# Patient Record
Sex: Female | Born: 1980 | Race: White | Hispanic: No | Marital: Single | Smoking: Current every day smoker
Health system: Southern US, Community
[De-identification: ages and names within clinical notes are randomized; demographics above are authoritative.]

## PROBLEM LIST (undated history)

## (undated) ENCOUNTER — Ambulatory Visit (HOSPITAL_COMMUNITY): Admission: EM | Discharge status: Absent without leave | Payer: BLUE CROSS/BLUE SHIELD

## (undated) ENCOUNTER — Ambulatory Visit (HOSPITAL_COMMUNITY)
Admission: EM | Discharge status: Absent without leave | Payer: BLUE CROSS/BLUE SHIELD | Attending: Internal Medicine | Admitting: Internal Medicine

## (undated) ENCOUNTER — Ambulatory Visit (HOSPITAL_COMMUNITY): Admission: EM | Payer: BLUE CROSS/BLUE SHIELD | Source: Home / Self Care

## (undated) DIAGNOSIS — R51 Headache: Secondary | ICD-10-CM

## (undated) DIAGNOSIS — S0291XA Unspecified fracture of skull, initial encounter for closed fracture: Secondary | ICD-10-CM

## (undated) DIAGNOSIS — N12 Tubulo-interstitial nephritis, not specified as acute or chronic: Secondary | ICD-10-CM

## (undated) DIAGNOSIS — A749 Chlamydial infection, unspecified: Secondary | ICD-10-CM

## (undated) DIAGNOSIS — I10 Essential (primary) hypertension: Secondary | ICD-10-CM

## (undated) DIAGNOSIS — A549 Gonococcal infection, unspecified: Secondary | ICD-10-CM

## (undated) DIAGNOSIS — R519 Headache, unspecified: Secondary | ICD-10-CM

## (undated) HISTORY — PX: FACIAL NERVE DECOMPRESSION: SHX185

---
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
## 2005-01-24 ENCOUNTER — Inpatient Hospital Stay (HOSPITAL_COMMUNITY): Admission: EM | Admit: 2005-01-24 | Discharge: 2005-01-31 | Payer: Self-pay | Admitting: Emergency Medicine

## 2005-01-24 ENCOUNTER — Ambulatory Visit: Payer: Self-pay | Admitting: Physical Medicine & Rehabilitation

## 2005-02-11 ENCOUNTER — Inpatient Hospital Stay (HOSPITAL_COMMUNITY): Admission: EM | Admit: 2005-02-11 | Discharge: 2005-02-15 | Payer: Self-pay | Admitting: Emergency Medicine

## 2005-02-17 ENCOUNTER — Emergency Department (HOSPITAL_COMMUNITY): Admission: EM | Admit: 2005-02-17 | Discharge: 2005-02-17 | Payer: Self-pay | Admitting: Emergency Medicine

## 2005-02-18 ENCOUNTER — Emergency Department (HOSPITAL_COMMUNITY): Admission: EM | Admit: 2005-02-18 | Discharge: 2005-02-18 | Payer: Self-pay | Admitting: Emergency Medicine

## 2005-02-25 ENCOUNTER — Ambulatory Visit (HOSPITAL_COMMUNITY): Admission: RE | Admit: 2005-02-25 | Discharge: 2005-02-25 | Payer: Self-pay | Admitting: General Surgery

## 2005-08-13 ENCOUNTER — Emergency Department (HOSPITAL_COMMUNITY): Admission: EM | Admit: 2005-08-13 | Discharge: 2005-08-13 | Payer: Self-pay | Admitting: Emergency Medicine

## 2005-09-03 ENCOUNTER — Emergency Department (HOSPITAL_COMMUNITY): Admission: EM | Admit: 2005-09-03 | Discharge: 2005-09-03 | Payer: Self-pay | Admitting: Emergency Medicine

## 2005-11-18 ENCOUNTER — Emergency Department (HOSPITAL_COMMUNITY): Admission: EM | Admit: 2005-11-18 | Discharge: 2005-11-18 | Payer: Self-pay | Admitting: Emergency Medicine

## 2006-02-06 ENCOUNTER — Emergency Department (HOSPITAL_COMMUNITY): Admission: EM | Admit: 2006-02-06 | Discharge: 2006-02-06 | Payer: Self-pay | Admitting: Emergency Medicine

## 2007-01-23 ENCOUNTER — Emergency Department (HOSPITAL_COMMUNITY): Admission: EM | Admit: 2007-01-23 | Discharge: 2007-01-23 | Payer: Self-pay | Admitting: Family Medicine

## 2007-02-19 ENCOUNTER — Emergency Department (HOSPITAL_COMMUNITY): Admission: EM | Admit: 2007-02-19 | Discharge: 2007-02-19 | Payer: Self-pay | Admitting: Emergency Medicine

## 2007-07-13 ENCOUNTER — Emergency Department (HOSPITAL_COMMUNITY): Admission: EM | Admit: 2007-07-13 | Discharge: 2007-07-13 | Payer: Self-pay | Admitting: Family Medicine

## 2007-09-07 IMAGING — CT CT HEAD W/O CM
1 of 4 series · 10 of 30 positions shown, 13 images · IV contrast (agent unspecified)
Comparison: 01/25/2005.

CLINICAL DATA: Brain contusions.  Follow-up.
 HEAD CT WITHOUT CONTRAST:
TECHNIQUE: Contiguous axial images were obtained from the base of the skull through the vertex according to standard protocol without contrast.

[Series 2: brain · axial · 0.47mm/px · z∈[+118,+249]mm · 10 of 32 slices shown, 13 images]
[im 3/32  brain]
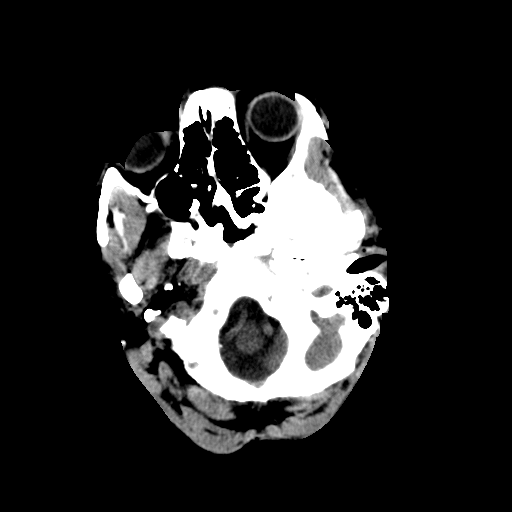
[im 3/32  bone]
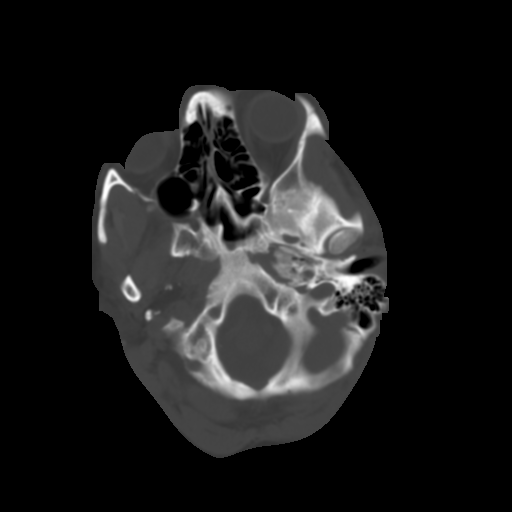
[im 6/32  brain]
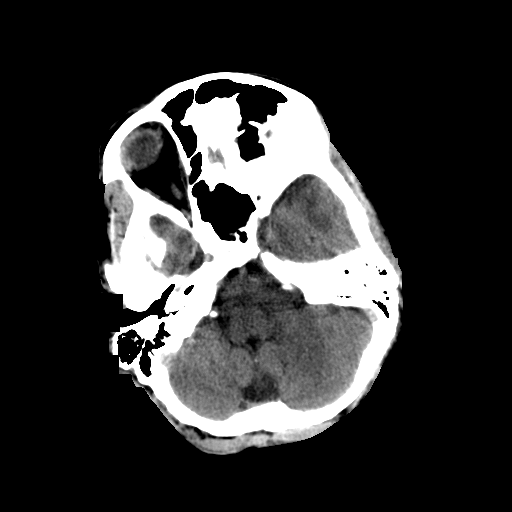
[im 9/32  brain]
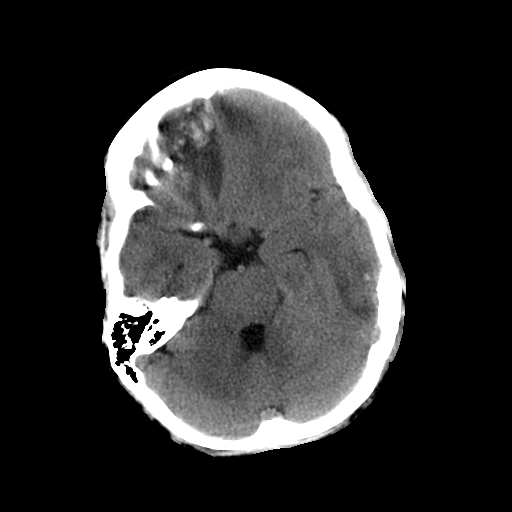
[im 12/32  brain]
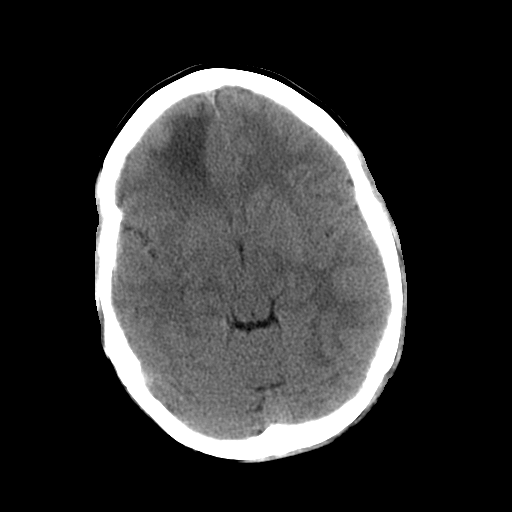
[im 15/32  brain]
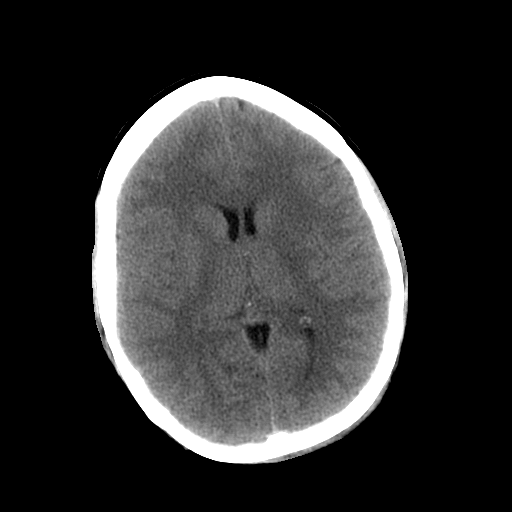
[im 15/32  bone]
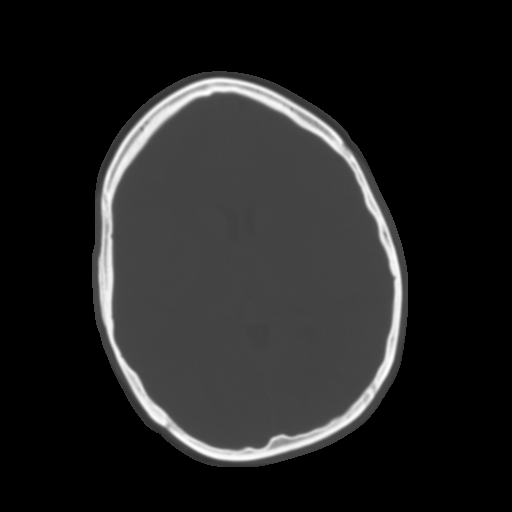
[im 17/32  brain]
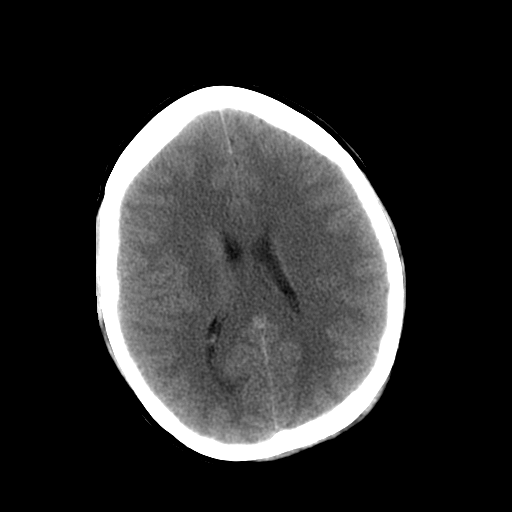
[im 20/32  brain]
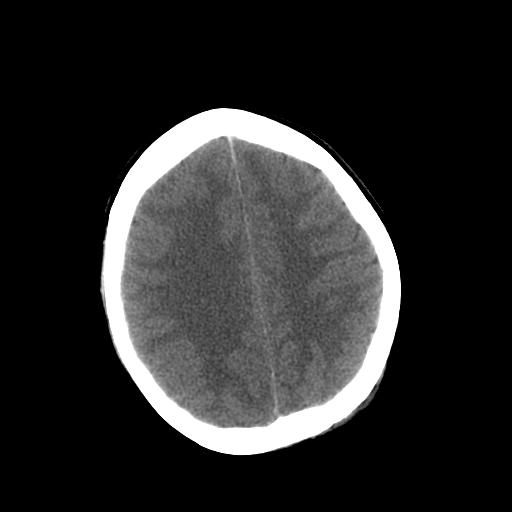
[im 23/32  brain]
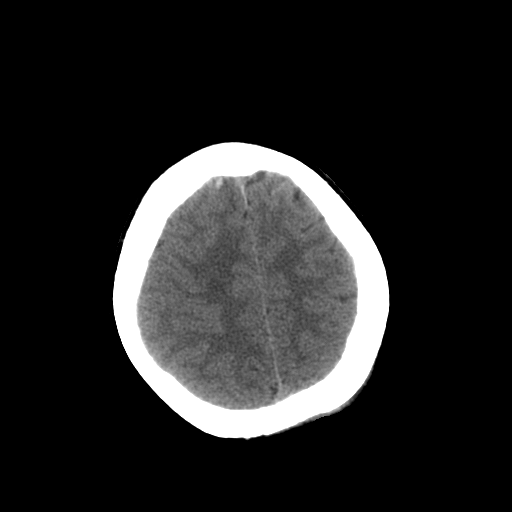
[im 26/32  brain]
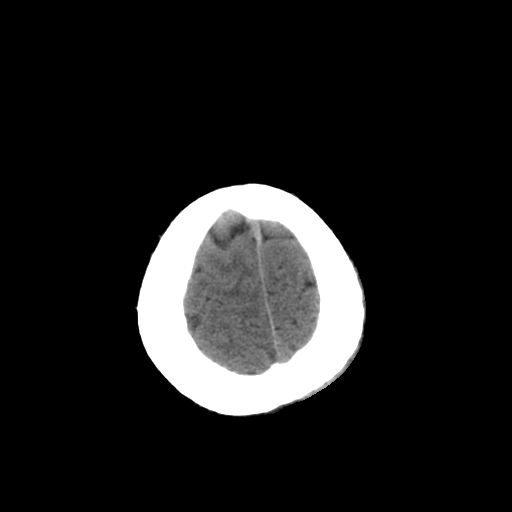
[im 26/32  bone]
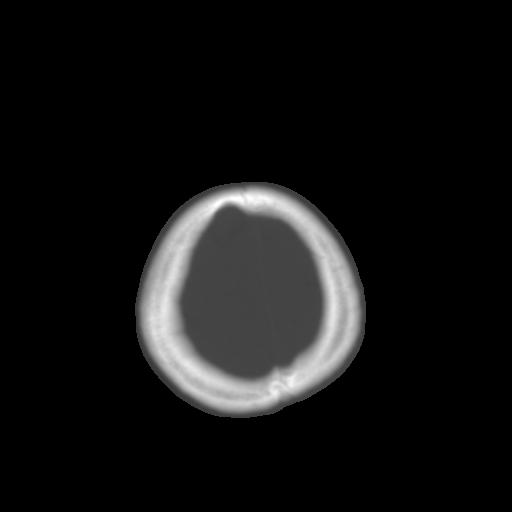
[im 29/32  brain]
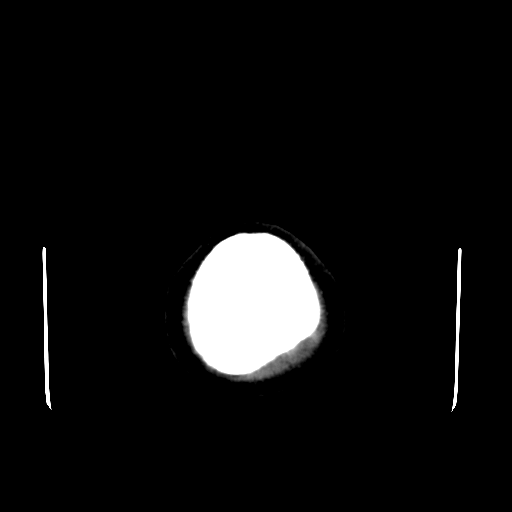

[10 of 30 positions shown; findings below may reference images not displayed]

FINDINGS: Within the right frontal lobe (image #22) focal area of contusion and hemorrhage is again noted.  When compared with the prior exam, the appearance is unchanged.  In the inferior right frontal lobe anteriorly there is a contusion which extends across the midline.  The hemorrhagic component is stable to slightly improved in the interval.  Surrounding low attenuation consistent with edema is not changed.
 Left temporal lobe there is additional contusion with small hemorrhagic component unchanged.
 No new areas of hemorrhage are identified.
 There is no evidence for hydrocephalus.
 Mastoid air cells are normally aerated.  There is mild sphenoid sinus disease.  The remaining paranasal sinuses are normally aerated.
 Complex fracture of the left temporal bone with multiple fracture planes again identified.  This is not significantly changed when compared to the prior exam.
IMPRESSION: 1. Multiple evolving hemorrhagic contusions to the brain.
 2. Overall, no significant interval change.
 3. Complex non-displaced comminuted fracture through the left temporal bone stable.

## 2007-12-18 ENCOUNTER — Emergency Department (HOSPITAL_COMMUNITY): Admission: EM | Admit: 2007-12-18 | Discharge: 2007-12-18 | Payer: Self-pay | Admitting: Emergency Medicine

## 2007-12-19 ENCOUNTER — Emergency Department (HOSPITAL_COMMUNITY): Admission: EM | Admit: 2007-12-19 | Discharge: 2007-12-19 | Payer: Self-pay | Admitting: Emergency Medicine

## 2007-12-20 ENCOUNTER — Emergency Department (HOSPITAL_COMMUNITY): Admission: EM | Admit: 2007-12-20 | Discharge: 2007-12-21 | Payer: Self-pay | Admitting: Emergency Medicine

## 2007-12-22 ENCOUNTER — Emergency Department (HOSPITAL_COMMUNITY): Admission: EM | Admit: 2007-12-22 | Discharge: 2007-12-22 | Payer: Self-pay | Admitting: Family Medicine

## 2008-05-05 ENCOUNTER — Emergency Department (HOSPITAL_COMMUNITY): Admission: EM | Admit: 2008-05-05 | Discharge: 2008-05-05 | Payer: Self-pay | Admitting: Emergency Medicine

## 2008-07-01 ENCOUNTER — Emergency Department (HOSPITAL_COMMUNITY): Admission: EM | Admit: 2008-07-01 | Discharge: 2008-07-01 | Payer: Self-pay | Admitting: Family Medicine

## 2008-07-20 ENCOUNTER — Emergency Department (HOSPITAL_COMMUNITY): Admission: EM | Admit: 2008-07-20 | Discharge: 2008-07-20 | Payer: Self-pay | Admitting: Family Medicine

## 2009-02-27 ENCOUNTER — Emergency Department (HOSPITAL_COMMUNITY): Admission: EM | Admit: 2009-02-27 | Discharge: 2009-02-27 | Payer: Self-pay | Admitting: Family Medicine

## 2009-05-16 ENCOUNTER — Emergency Department (HOSPITAL_COMMUNITY): Admission: EM | Admit: 2009-05-16 | Discharge: 2009-05-16 | Payer: Self-pay | Admitting: Emergency Medicine

## 2009-05-25 ENCOUNTER — Emergency Department (HOSPITAL_COMMUNITY): Admission: EM | Admit: 2009-05-25 | Discharge: 2009-05-25 | Payer: Self-pay | Admitting: Family Medicine

## 2009-05-26 ENCOUNTER — Emergency Department (HOSPITAL_COMMUNITY): Admission: EM | Admit: 2009-05-26 | Discharge: 2009-05-26 | Payer: Self-pay | Admitting: Emergency Medicine

## 2009-11-26 ENCOUNTER — Emergency Department (HOSPITAL_COMMUNITY): Admission: EM | Admit: 2009-11-26 | Discharge: 2009-11-26 | Payer: Self-pay | Admitting: Family Medicine

## 2010-05-01 LAB — POCT URINALYSIS DIPSTICK
Bilirubin Urine: NEGATIVE
Glucose, UA: NEGATIVE mg/dL
Ketones, ur: NEGATIVE mg/dL
Nitrite: NEGATIVE
Protein, ur: NEGATIVE mg/dL
Specific Gravity, Urine: 1.01 (ref 1.005–1.030)
Urobilinogen, UA: 0.2 mg/dL (ref 0.0–1.0)
pH: 7 (ref 5.0–8.0)

## 2010-05-01 LAB — URINE CULTURE
Colony Count: NO GROWTH
Culture  Setup Time: 201110111743
Culture: NO GROWTH

## 2010-05-01 LAB — GC/CHLAMYDIA PROBE AMP, GENITAL
Chlamydia, DNA Probe: NEGATIVE
GC Probe Amp, Genital: NEGATIVE

## 2010-05-01 LAB — WET PREP, GENITAL
Trich, Wet Prep: NONE SEEN
Yeast Wet Prep HPF POC: NONE SEEN

## 2010-05-01 LAB — POCT PREGNANCY, URINE: Preg Test, Ur: NEGATIVE

## 2010-05-04 LAB — GC/CHLAMYDIA PROBE AMP, GENITAL
Chlamydia, DNA Probe: NEGATIVE
GC Probe Amp, Genital: NEGATIVE

## 2010-05-11 LAB — WET PREP, GENITAL
Trich, Wet Prep: NONE SEEN
Yeast Wet Prep HPF POC: NONE SEEN

## 2010-05-11 LAB — GC/CHLAMYDIA PROBE AMP, GENITAL
Chlamydia, DNA Probe: NEGATIVE
GC Probe Amp, Genital: NEGATIVE

## 2010-05-26 LAB — WET PREP, GENITAL: Trich, Wet Prep: NONE SEEN

## 2010-05-26 LAB — GC/CHLAMYDIA PROBE AMP, GENITAL: GC Probe Amp, Genital: NEGATIVE

## 2010-05-27 LAB — GC/CHLAMYDIA PROBE AMP, GENITAL: Chlamydia, DNA Probe: NEGATIVE

## 2010-05-27 LAB — WET PREP, GENITAL: Yeast Wet Prep HPF POC: NONE SEEN

## 2010-05-29 LAB — POCT PREGNANCY, URINE: Preg Test, Ur: NEGATIVE

## 2010-05-29 LAB — URINE CULTURE: Colony Count: 15000

## 2010-05-29 LAB — URINALYSIS, ROUTINE W REFLEX MICROSCOPIC
Bilirubin Urine: NEGATIVE
Ketones, ur: NEGATIVE mg/dL
Nitrite: NEGATIVE
Urobilinogen, UA: 0.2 mg/dL (ref 0.0–1.0)
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
## 2010-10-20 ENCOUNTER — Emergency Department (HOSPITAL_COMMUNITY)
Admission: EM | Admit: 2010-10-20 | Discharge: 2010-10-20 | Disposition: A | Discharge status: Home / Self Care | Payer: Self-pay | Attending: Emergency Medicine | Admitting: Emergency Medicine

## 2010-10-20 DIAGNOSIS — N390 Urinary tract infection, site not specified: Secondary | ICD-10-CM | POA: Insufficient documentation

## 2010-10-20 DIAGNOSIS — R630 Anorexia: Secondary | ICD-10-CM | POA: Insufficient documentation

## 2010-10-20 DIAGNOSIS — F988 Other specified behavioral and emotional disorders with onset usually occurring in childhood and adolescence: Secondary | ICD-10-CM | POA: Insufficient documentation

## 2010-10-20 DIAGNOSIS — R112 Nausea with vomiting, unspecified: Secondary | ICD-10-CM | POA: Insufficient documentation

## 2010-10-20 DIAGNOSIS — R1013 Epigastric pain: Secondary | ICD-10-CM | POA: Insufficient documentation

## 2010-10-20 LAB — RAPID URINE DRUG SCREEN, HOSP PERFORMED
Amphetamines: NOT DETECTED
Benzodiazepines: NOT DETECTED
Opiates: NOT DETECTED

## 2010-10-20 LAB — CBC
MCV: 90.5 fL (ref 78.0–100.0)
Platelets: 268 10*3/uL (ref 150–400)
RBC: 4.42 MIL/uL (ref 3.87–5.11)
WBC: 12.6 10*3/uL — ABNORMAL HIGH (ref 4.0–10.5)

## 2010-10-20 LAB — LIPASE, BLOOD: Lipase: 18 U/L (ref 11–59)

## 2010-10-20 LAB — URINE MICROSCOPIC-ADD ON

## 2010-10-20 LAB — COMPREHENSIVE METABOLIC PANEL
BUN: 6 mg/dL (ref 6–23)
Calcium: 9.6 mg/dL (ref 8.4–10.5)
Creatinine, Ser: 0.47 mg/dL — ABNORMAL LOW (ref 0.50–1.10)
GFR calc Af Amer: >60 mL/min (ref >60)
Glucose, Bld: 142 mg/dL — ABNORMAL HIGH (ref 70–99)
Sodium: 145 mEq/L (ref 135–145)
Total Protein: 7.5 g/dL (ref 6.0–8.3)

## 2010-10-20 LAB — DIFFERENTIAL
Eosinophils Absolute: 0.1 10*3/uL (ref 0.0–0.7)
Lymphocytes Relative: 16 % (ref 12–46)
Lymphs Abs: 2 10*3/uL (ref 0.7–4.0)
Neutrophils Relative %: 78 % — ABNORMAL HIGH (ref 43–77)

## 2010-10-20 LAB — ETHANOL: Alcohol, Ethyl (B): <11 mg/dL (ref 0–11)

## 2010-10-20 LAB — URINALYSIS, ROUTINE W REFLEX MICROSCOPIC
Bilirubin Urine: NEGATIVE
Protein, ur: 30 mg/dL — AB
Urobilinogen, UA: 1 mg/dL (ref 0.0–1.0)

## 2010-10-20 LAB — WET PREP, GENITAL
Trich, Wet Prep: NONE SEEN
Yeast Wet Prep HPF POC: NONE SEEN

## 2010-10-22 LAB — URINE CULTURE
Colony Count: >=100000
Culture  Setup Time: 201209031142

## 2010-11-06 LAB — WET PREP, GENITAL
Clue Cells Wet Prep HPF POC: NONE SEEN
Trich, Wet Prep: NONE SEEN
Yeast Wet Prep HPF POC: NONE SEEN

## 2010-11-06 LAB — URINALYSIS, ROUTINE W REFLEX MICROSCOPIC
Bilirubin Urine: NEGATIVE
Hgb urine dipstick: NEGATIVE
Ketones, ur: NEGATIVE
Nitrite: NEGATIVE
Urobilinogen, UA: 1

## 2010-11-06 LAB — GC/CHLAMYDIA PROBE AMP, GENITAL: GC Probe Amp, Genital: NEGATIVE

## 2010-11-12 LAB — WET PREP, GENITAL
Trich, Wet Prep: NONE SEEN
WBC, Wet Prep HPF POC: NONE SEEN
Yeast Wet Prep HPF POC: NONE SEEN

## 2010-11-12 LAB — POCT URINALYSIS DIP (DEVICE)
Ketones, ur: NEGATIVE
Nitrite: POSITIVE — AB
Urobilinogen, UA: 0.2

## 2010-11-12 LAB — POCT PREGNANCY, URINE
Operator id: 247071
Preg Test, Ur: NEGATIVE

## 2010-11-12 LAB — GC/CHLAMYDIA PROBE AMP, GENITAL: GC Probe Amp, Genital: NEGATIVE

## 2010-11-24 LAB — URINALYSIS, ROUTINE W REFLEX MICROSCOPIC
Bilirubin Urine: NEGATIVE
Hgb urine dipstick: NEGATIVE
Nitrite: NEGATIVE
Protein, ur: NEGATIVE
Specific Gravity, Urine: 1.021
Urobilinogen, UA: 0.2

## 2010-11-24 LAB — GC/CHLAMYDIA PROBE AMP, URINE
Chlamydia, Swab/Urine, PCR: NEGATIVE
GC Probe Amp, Urine: POSITIVE — AB

## 2010-11-24 LAB — POCT URINALYSIS DIP (DEVICE)
Ketones, ur: NEGATIVE
Operator id: 235561
Protein, ur: NEGATIVE
Specific Gravity, Urine: 1.02
pH: 6

## 2010-11-24 LAB — URINE MICROSCOPIC-ADD ON

## 2010-11-24 LAB — URINE CULTURE: Colony Count: 30000

## 2011-05-07 ENCOUNTER — Encounter (HOSPITAL_COMMUNITY): Payer: Self-pay

## 2011-05-07 ENCOUNTER — Emergency Department (HOSPITAL_COMMUNITY)
Admission: EM | Admit: 2011-05-07 | Discharge: 2011-05-07 | Disposition: A | Discharge status: Home / Self Care | Payer: Self-pay | Attending: Emergency Medicine | Admitting: Emergency Medicine

## 2011-05-07 DIAGNOSIS — N949 Unspecified condition associated with female genital organs and menstrual cycle: Secondary | ICD-10-CM | POA: Insufficient documentation

## 2011-05-07 DIAGNOSIS — N750 Cyst of Bartholin's gland: Secondary | ICD-10-CM

## 2011-05-07 DIAGNOSIS — N9489 Other specified conditions associated with female genital organs and menstrual cycle: Secondary | ICD-10-CM | POA: Insufficient documentation

## 2011-05-07 DIAGNOSIS — N751 Abscess of Bartholin's gland: Secondary | ICD-10-CM | POA: Insufficient documentation

## 2011-05-07 MED ORDER — CLINDAMYCIN HCL 150 MG PO CAPS: 150.0000 mg | ORAL_CAPSULE | Freq: Four times a day (QID) | ORAL | Status: AC

## 2011-05-07 MED ORDER — OXYCODONE-ACETAMINOPHEN 5-325 MG PO TABS: 1.0000 | ORAL_TABLET | Freq: Four times a day (QID) | ORAL | Status: AC | PRN

## 2011-05-07 MED ORDER — OXYCODONE-ACETAMINOPHEN 5-325 MG PO TABS
2.0000 | ORAL_TABLET | Freq: Once | ORAL | Status: AC
  Administered 2011-05-07: 2 via ORAL
  Filled 2011-05-07: qty 2

## 2011-05-07 MED ORDER — LIDOCAINE HCL 1 % IJ SOLN
30.0000 mL | Freq: Once | INTRAMUSCULAR | Status: AC
  Administered 2011-05-07: 20 mL via INTRADERMAL
  Filled 2011-05-07: qty 20

## 2011-05-07 MED ORDER — ONDANSETRON 4 MG PO TBDP
4.0000 mg | ORAL_TABLET | Freq: Once | ORAL | Status: AC
  Administered 2011-05-07: 4 mg via ORAL
  Filled 2011-05-07: qty 1

## 2011-05-07 NOTE — Discharge Instructions (Signed)
Bartholin's Cyst or Abscess Bartholin's glands are small glands located within the folds of skin (labia) along the sides of the lower opening of the vagina (birth canal). A cyst may develop when the duct of the gland becomes blocked. When this happens, fluid that accumulates within the cyst can become infected. This is known as an abscess. The Bartholin gland produces a mucous fluid to lubricate the outside of the vagina during sexual intercourse. SYMPTOMS   Patients with a small cyst may not have any symptoms.   Mild discomfort to severe pain depending on the size of the cyst and if it is infected (abscess).   Pain, redness, and swelling around the lower opening of the vagina.   Painful intercourse.   Pressure in the perineal area.   Swelling of the lips of the vagina (labia).   The cyst or abscess can be on one side or both sides of the vagina.  DIAGNOSIS   A large swelling is seen in the lower vagina area by your caregiver.   Painful to touch.   Redness and pain, if it is an abscess.  TREATMENT   Sometimes the cyst will go away on its own.   Apply warm wet compresses to the area or take hot sitz baths several times a day.   An incision to drain the cyst or abscess with local anesthesia.   Culture the pus, if it is an abscess.   Antibiotic treatment, if it is an abscess.   Cut open the gland and suture the edges to make the opening of the gland bigger (marsupialization).   Remove the whole gland if the cyst or abscess returns.  PREVENTION   Practice good hygiene.   Clean the vaginal area with a mild soap and soft cloth when bathing.   Do not rub hard in the vaginal area when bathing.   Protect the crotch area with a padded cushion if you take long bike rides or ride horses.   Be sure you are well lubricated when you have sexual intercourse.  HOME CARE INSTRUCTIONS   If your cyst or abscess was opened, a small piece of gauze, or a drain, may have been placed in  the wound to allow drainage. Do not remove this gauze or drain unless directed by your caregiver.   Wear feminine pads, not tampons, as needed for any drainage or bleeding.   If antibiotics were prescribed, take them exactly as directed. Finish the entire course.   Only take over-the-counter or prescription medicines for pain, discomfort, or fever as directed by your caregiver.  SEEK IMMEDIATE MEDICAL CARE IF:   You have an increase in pain, redness, swelling, or drainage.   You have bleeding from the wound which results in the use of more than the number of pads suggested by your caregiver in 24 hours.   You have chills.   You have a fever.   You develop any new problems (symptoms) or aggravation of your existing condition.  MAKE SURE YOU:   Understand these instructions.   Will watch your condition.   Will get help right away if you are not doing well or get worse.  Document Released: 02/02/2005 Document Revised: 01/22/2011 Document Reviewed: 09/21/2007 [employer] Patient Information 2012 [employer]. 

## 2011-05-07 NOTE — ED Notes (Signed)
Pt complains of a sore knot in her vaginal area, hx of bartholians cysts

## 2011-05-07 NOTE — ED Provider Notes (Signed)
History     CSN: [id number]  Arrival date & time 05/07/11  1191   First MD Initiated Contact with Patient 05/07/11 [phone]      Chief Complaint  Patient presents with  . Recurrent Skin Infections    (Consider location/radiation/quality/duration/timing/severity/associated sxs/prior treatment) HPI  31 year old female with history of Bartholin's cyst presents with the chief complaints of vaginal swelling. The patient has been noticing swelling to the vagina area for the past one to 2 days. Symptoms was gradual but progressive. Described pain as a throbbing sensation. Swelling felt similar to her prior Bartholin cyst that required drainage. She denies fever, abdominal pain or, dysuria, vaginal discharge, or rash. She denies any recent trauma. She does not think she's pregnant.  History reviewed. No pertinent past medical history.  History reviewed. No pertinent past surgical history.  History reviewed. No pertinent family history.  History  Substance Use Topics  . Smoking status: Not on file  . Smokeless tobacco: Not on file  . Alcohol Use: Yes    OB History    Grav Para Term Preterm Abortions TAB SAB Ect Mult Living                  Review of Systems  All other systems reviewed and are negative.    Allergies  Sulfa antibiotics  Home Medications   Current Outpatient Rx  Name Route Sig Dispense Refill  . IBUPROFEN 200 MG PO TABS Oral Take 400 mg by mouth every 6 (six) hours as needed. Pain      BP 127/75  Pulse 89  Temp(Src) 98.6 F (37 C) (Oral)  Resp 16  Ht 5\' 6"  (1.676 m)  Wt 156 lb 12.8 oz (71.124 kg)  BMI 25.31 kg/m2  SpO2 100%  Physical Exam  Nursing note and vitals reviewed. Constitutional: She appears well-developed and well-nourished. No distress.  HENT:  Head: Atraumatic.  Eyes: Conjunctivae are normal.  Abdominal: Soft. There is no tenderness.  Musculoskeletal: Normal range of motion.  Skin:       Vagina: Moderate edema, and fluctuance noted  lateral to lateral aspect of left labial consistence with a Bartholin's abscess.  Tenderness on palpation. No erythema noted. Non-petechial    ED Course  Procedures (including critical care time)  Labs Reviewed - No data to display No results found.   No diagnosis found.  INCISION AND DRAINAGE Performed by: Fayrene Helper Consent: Verbal consent obtained. Risks and benefits: risks, benefits and alternatives were discussed Type: abscess  Body area: L bartholin's abscess  Anesthesia: local infiltration  Local anesthetic: lidocaine 2% w/out epinephrine  Anesthetic total: 3 ml  Complexity: complex Blunt dissection to break up loculations  Drainage: purulent  Drainage amount: moderate  Packing material: No packing placed, no Word Catheter as pt refuses  Patient tolerance: Patient tolerated the procedure well with no immediate complications.   MDM  Likely Bartholin's cyst which may require I&D. Pain medication given.  8:33 AM Successful I&D of Bartholin's cyst. Patient requests not to place any catheter or packing as she has allergic reaction in the past. Care instructions given. Patient will be discharged with antibiotic and pain medication with followup instructions.        Fayrene Helper, PA-C 05/07/11 [phone]

## 2011-05-07 NOTE — ED Provider Notes (Signed)
Medical screening examination/treatment/procedure(s) were performed by non-physician practitioner and as supervising physician I was immediately available for consultation/collaboration.  Geoffery Lyons, MD 05/07/11 [phone]

## 2011-06-24 ENCOUNTER — Emergency Department (HOSPITAL_COMMUNITY): Payer: Self-pay

## 2011-06-24 ENCOUNTER — Observation Stay (HOSPITAL_COMMUNITY)
Admission: EM | Admit: 2011-06-24 | Discharge: 2011-06-25 | Disposition: A | Discharge status: Home / Self Care | Payer: Self-pay | Attending: Neurosurgery | Admitting: Neurosurgery

## 2011-06-24 ENCOUNTER — Encounter (HOSPITAL_COMMUNITY): Payer: Self-pay | Admitting: Emergency Medicine

## 2011-06-24 DIAGNOSIS — Y998 Other external cause status: Secondary | ICD-10-CM | POA: Insufficient documentation

## 2011-06-24 DIAGNOSIS — Y92009 Unspecified place in unspecified non-institutional (private) residence as the place of occurrence of the external cause: Secondary | ICD-10-CM | POA: Insufficient documentation

## 2011-06-24 DIAGNOSIS — S02109A Fracture of base of skull, unspecified side, initial encounter for closed fracture: Principal | ICD-10-CM | POA: Insufficient documentation

## 2011-06-24 DIAGNOSIS — F17200 Nicotine dependence, unspecified, uncomplicated: Secondary | ICD-10-CM | POA: Insufficient documentation

## 2011-06-24 DIAGNOSIS — S0291XA Unspecified fracture of skull, initial encounter for closed fracture: Secondary | ICD-10-CM

## 2011-06-24 DIAGNOSIS — G9389 Other specified disorders of brain: Secondary | ICD-10-CM | POA: Insufficient documentation

## 2011-06-24 DIAGNOSIS — W108XXA Fall (on) (from) other stairs and steps, initial encounter: Secondary | ICD-10-CM | POA: Insufficient documentation

## 2011-06-24 LAB — BASIC METABOLIC PANEL
BUN: 6 mg/dL (ref 6–23)
CO2: 23 mEq/L (ref 19–32)
Calcium: 8.8 mg/dL (ref 8.4–10.5)
Chloride: 107 mEq/L (ref 96–112)
GFR calc Af Amer: >90 mL/min (ref >90)
Glucose, Bld: 110 mg/dL — ABNORMAL HIGH (ref 70–99)
Potassium: 3.7 mEq/L (ref 3.5–5.1)
Sodium: 141 mEq/L (ref 135–145)

## 2011-06-24 LAB — CBC
HCT: 40.6 % (ref 36.0–46.0)
Hemoglobin: 14.1 g/dL (ref 12.0–15.0)
RBC: 4.42 MIL/uL (ref 3.87–5.11)
WBC: 16.5 10*3/uL — ABNORMAL HIGH (ref 4.0–10.5)

## 2011-06-24 LAB — MRSA PCR SCREENING: MRSA by PCR: NEGATIVE

## 2011-06-24 MED ORDER — HYDROCODONE-ACETAMINOPHEN 5-325 MG PO TABS
2.0000 | ORAL_TABLET | ORAL | Status: DC | PRN
  Administered 2011-06-24: 2 via ORAL
  Filled 2011-06-24: qty 2

## 2011-06-24 MED ORDER — ONDANSETRON HCL 4 MG/2ML IJ SOLN
4.0000 mg | Freq: Once | INTRAMUSCULAR | Status: AC
  Administered 2011-06-24: 4 mg via INTRAVENOUS
  Filled 2011-06-24: qty 2

## 2011-06-24 MED ORDER — SODIUM CHLORIDE 0.9 % IV SOLN
INTRAVENOUS | Status: DC
  Administered 2011-06-24: 08:00:00 via INTRAVENOUS

## 2011-06-24 MED ORDER — ONDANSETRON HCL 4 MG/2ML IJ SOLN
4.0000 mg | INTRAMUSCULAR | Status: DC | PRN
  Administered 2011-06-24 – 2011-06-25 (×3): 4 mg via INTRAVENOUS
  Filled 2011-06-24 (×2): qty 2

## 2011-06-24 MED ORDER — ONDANSETRON HCL 4 MG/2ML IJ SOLN
INTRAMUSCULAR | Status: AC
  Filled 2011-06-24: qty 2

## 2011-06-24 MED ORDER — ONDANSETRON HCL 4 MG/2ML IJ SOLN: 4.0000 mg | Freq: Four times a day (QID) | INTRAMUSCULAR | Status: DC | PRN

## 2011-06-24 MED ORDER — MORPHINE SULFATE 2 MG/ML IJ SOLN
2.0000 mg | INTRAMUSCULAR | Status: DC | PRN
  Administered 2011-06-24 – 2011-06-25 (×4): 2 mg via INTRAVENOUS
  Filled 2011-06-24 (×4): qty 1

## 2011-06-24 MED ORDER — ONDANSETRON HCL 4 MG PO TABS
4.0000 mg | ORAL_TABLET | ORAL | Status: DC | PRN
  Administered 2011-06-24: 4 mg via ORAL
  Filled 2011-06-24: qty 1

## 2011-06-24 MED ORDER — ONDANSETRON 8 MG PO TBDP
8.0000 mg | ORAL_TABLET | Freq: Once | ORAL | Status: AC
  Administered 2011-06-24: 8 mg via ORAL
  Filled 2011-06-24: qty 1

## 2011-06-24 MED ORDER — ONDANSETRON HCL 4 MG/2ML IJ SOLN
4.0000 mg | Freq: Four times a day (QID) | INTRAMUSCULAR | Status: DC | PRN
  Administered 2011-06-24: 4 mg via INTRAVENOUS
  Filled 2011-06-24: qty 2

## 2011-06-24 MED ORDER — HYDROCODONE-ACETAMINOPHEN 5-325 MG PO TABS
1.0000 | ORAL_TABLET | ORAL | Status: DC | PRN
  Administered 2011-06-24: 1 via ORAL
  Filled 2011-06-24 (×2): qty 1

## 2011-06-24 MED ORDER — MECLIZINE HCL 25 MG PO TABS
25.0000 mg | ORAL_TABLET | Freq: Once | ORAL | Status: AC
  Administered 2011-06-24: 25 mg via ORAL
  Filled 2011-06-24: qty 1

## 2011-06-24 MED ORDER — MORPHINE SULFATE 4 MG/ML IJ SOLN
4.0000 mg | Freq: Once | INTRAMUSCULAR | Status: AC
  Administered 2011-06-24: 4 mg via INTRAVENOUS
  Filled 2011-06-24: qty 1

## 2011-06-24 MED ORDER — POTASSIUM CHLORIDE 2 MEQ/ML IV SOLN
INTRAVENOUS | Status: DC
  Administered 2011-06-24 – 2011-06-25 (×2): via INTRAVENOUS
  Filled 2011-06-24 (×3): qty 1000

## 2011-06-24 MED ORDER — CEFAZOLIN SODIUM 1-5 GM-% IV SOLN
1.0000 g | Freq: Once | INTRAVENOUS | Status: AC
  Administered 2011-06-24: 1 g via INTRAVENOUS
  Filled 2011-06-24: qty 50

## 2011-06-24 MED ORDER — ONDANSETRON HCL 4 MG PO TABS: 4.0000 mg | ORAL_TABLET | Freq: Four times a day (QID) | ORAL | Status: DC | PRN

## 2011-06-24 MED ORDER — ACETAMINOPHEN 325 MG PO TABS: 650.0000 mg | ORAL_TABLET | ORAL | Status: DC | PRN

## 2011-06-24 MED ORDER — ONDANSETRON HCL 4 MG PO TABS: 4.0000 mg | ORAL_TABLET | ORAL | Status: DC | PRN
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
## 2011-06-24 NOTE — H&P (Signed)
Subjective: The patient is a 31 year old white female who fell down some steps approximately 0 3:30 this morning. There was no reported loss of consciousness. Although she did feel a bit dizzy. The patient noted some blood coming from her left ear and went to the [facility] long emergency department. There she was worked up by Dr. Erlinda Hong to include a head CT scan. This demonstrated a left temporal fracture and a neurosurgical consultation was requested. The patient is transferred to [facility] for further neurosurgical care.  Presently the patient is accompanied by her fiber and a friend. She complains of left temporal headaches. She says the blood has stopped draining from her ear. She denies nausea vomiting seizures numbness tingling weakness neck pain back pain chest pain abdominal pain etc.  History reviewed. No pertinent past medical history.  History reviewed. No pertinent past surgical history.  Allergies  Allergen Reactions  . Sulfa Antibiotics     History  Substance Use Topics  . Smoking status: Current Everyday Smoker -- 0.5 packs/day for 15 years    Types: Cigarettes  . Smokeless tobacco: Not on file  . Alcohol Use: No    History reviewed. No pertinent family history. Prior to Admission medications   Medication Sig Start Date End Date Taking? Authorizing Provider  ibuprofen (ADVIL,MOTRIN) 200 MG tablet Take 400 mg by mouth every 6 (six) hours as needed. Pain   Yes Historical Provider, MD     Review of Systems  Positive ROS: As above  All other systems have been reviewed and were otherwise negative with the exception of those mentioned in the HPI and as above.  Objective: Vital signs in last 24 hours: Temp:  [98.3 F (36.8 C)-98.6 F (37 C)] 98.3 F (36.8 C) (05/08 0930) Pulse Rate:  [64-89] 69  (05/08 1200) Resp:  [15-23] 23  (05/08 1200) BP: (95-132)/(60-83) 95/60 mmHg (05/08 1200) SpO2:  [98 %-100 %] 98 % (05/08 1200) Weight:  [68.947 kg (152 lb)-70 kg  (154 lb 5.2 oz)] 70 kg (154 lb 5.2 oz) (05/08 1000)  General Appearance: Alert, cooperative, no distress, appears stated age Head: Normocephalic, without obvious abnormality, atraumatic Eyes: PERRL, conjunctiva/corneas clear, EOM's intact, fundi benign, both eyes      Ears: Normal TM's and external ear canals, both ears Throat: Lips, mucosa, and tongue normal; teeth and gums normal Neck: Supple, symmetrical, trachea midline, no adenopathy; thyroid: No enlargement/tenderness/nodules; no carotid bruit or JVD Back: Symmetric, no curvature, ROM normal, no CVA tenderness Lungs: Clear to auscultation bilaterally, respirations unlabored Heart: Regular rate and rhythm, S1 and S2 normal, no murmur, rub or gallop Abdomen: Soft, non-tender, bowel sounds active all four quadrants, no masses, no organomegaly Extremities: Extremities normal, atraumatic, no cyanosis or edema Pulses: 2+ and symmetric all extremities Skin: Skin color, texture, turgor normal, no rashes or lesions  NEUROLOGIC:   Mental status: alert and oriented, no aphasia, good attention span, Fund of knowledge/ memory ok Motor Exam - grossly normal Sensory Exam - grossly normal Reflexes: Symmetric Coordination - grossly normal Gait -not tested Balance - grossly normal Cranial Nerves: I: smell Not tested  II: visual acuity  OS: Normal    OD: Normal   II: visual fields Full to confrontation  II: pupils Equal, round, reactive to light  III,VII: ptosis None  III,IV,VI: extraocular muscles  Full ROM  V: mastication Normal  V: facial light touch sensation  Normal  V,VII: corneal reflex  Present  VII: facial muscle function - upper  Normal  VII: facial muscle function - lower Normal  VIII: hearing Not tested  IX: soft palate elevation  Normal  IX,X: gag reflex Present  XI: trapezius strength  5/5  XI: sternocleidomastoid strength 5/5  XI: neck flexion strength  5/5  XII: tongue strength  Normal    Data Review Lab Results    Component Value Date   WBC 16.5* 06/24/2011   HGB 14.1 06/24/2011   HCT 40.6 06/24/2011   MCV 91.9 06/24/2011   PLT 240 06/24/2011   Lab Results  Component Value Date   NA 141 06/24/2011   K 3.7 06/24/2011   CL 107 06/24/2011   CO2 23 06/24/2011   BUN 6 06/24/2011   CREATININE 0.55 06/24/2011   GLUCOSE 110* 06/24/2011   No results found for this basename: INR, PROTIME    Assessment/Plan: Small left temporal/mastoid air cell fracture: I discussed the situation with the patient and her family. I recommended she be admitted for observation. And that we repeat her CAT scan tomorrow. I have answered all her questions.   Cristi Loron 06/24/2011 12:48 PM
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
## 2011-06-24 NOTE — ED Notes (Addendum)
Presents to ED with c/o dizziness and left ear bleeding that "won't stop bleeding" after her fall from the steps at home around 3:30am. Also c/o dizziness, headache. Denies nausea. Has active bleeding noted from left ear on arrival--- no open areas noted to external ear. Denies losing consciousness with her fall.

## 2011-06-24 NOTE — ED Notes (Signed)
Report to [place] [facility] at [facility]. [facility] called for transport.

## 2011-06-24 NOTE — Discharge Instructions (Signed)
Transfer to [facility] [address], [address] Delma Officer, or on call MD for him, on patients arrival to unit/bed.

## 2011-06-24 NOTE — ED Notes (Signed)
Received call from [facility] pt awaiting transport at this time. Pt remains alert and oriented at this time. Pt is neurologically intact at this time.

## 2011-06-24 NOTE — ED Provider Notes (Addendum)
History     CSN: [id number]  Arrival date & time 06/24/11  0500   First MD Initiated Contact with Patient 06/24/11 [phone]      Chief Complaint  Patient presents with  . Fall  . Head Injury    (Consider location/radiation/quality/duration/timing/severity/associated sxs/prior treatment) Patient is a 31 y.o. female presenting with fall and head injury. The history is provided by the patient.  Fall Associated symptoms include headaches. Pertinent negatives include no fever, no numbness, no abdominal pain and no vomiting.  Head Injury  Pertinent negatives include no numbness, no vomiting and no weakness.  pt fell at home on steps this am. States missed step/tripped. Denies faintness or dizziness. Hit head. Denies loc. Notes bleeding from left ear since and feeling dizzy. Dizzy described as room spinning sensation, mild nausea. No severe headache, no change in vision. No numbness/weakness. No neck or back pain. Denies other injury.   History reviewed. No pertinent past medical history.  History reviewed. No pertinent past surgical history.  History reviewed. No pertinent family history.  History  Substance Use Topics  . Smoking status: Current Everyday Smoker  . Smokeless tobacco: Not on file  . Alcohol Use: No    OB History    Grav Para Term Preterm Abortions TAB SAB Ect Mult Living                  Review of Systems  Constitutional: Negative for fever and chills.  HENT: Negative for neck pain.   Eyes: Negative for pain and visual disturbance.  Respiratory: Negative for shortness of breath.   Cardiovascular: Negative for chest pain.  Gastrointestinal: Negative for vomiting and abdominal pain.  Genitourinary: Negative for flank pain.  Musculoskeletal: Negative for back pain.  Skin: Negative for rash.  Neurological: Positive for headaches. Negative for weakness and numbness.  Hematological: Does not bruise/bleed easily.  Psychiatric/Behavioral: Negative for confusion.     Allergies  Sulfa antibiotics  Home Medications   Current Outpatient Rx  Name Route Sig Dispense Refill  . IBUPROFEN 200 MG PO TABS Oral Take 400 mg by mouth every 6 (six) hours as needed. Pain      BP 132/83  Pulse 89  Temp(Src) 98.6 F (37 C) (Oral)  Resp 18  SpO2 98%  Physical Exam  Nursing note and vitals reviewed. Constitutional: She is oriented to person, place, and time. She appears well-developed and well-nourished. No distress.  HENT:  Nose: Nose normal.  Mouth/Throat: Oropharynx is clear and moist.       Blood in left eac, obscuring tm. No external ear lac, or apparent lac in eac noted. Right eac/tm wnl.   Eyes: Conjunctivae are normal. Pupils are equal, round, and reactive to light. No scleral icterus.  Neck: Normal range of motion. Neck supple. No tracheal deviation present.  Cardiovascular: Normal rate, regular rhythm, normal heart sounds and intact distal pulses.   Pulmonary/Chest: Effort normal. No respiratory distress.  Abdominal: Soft. Normal appearance. She exhibits no distension. There is no tenderness.  Musculoskeletal: She exhibits no edema and no tenderness.       ctls spine non tender, aligned, no step off.  Good rom bil extremities without pain or focal bony tenderness  Neurological: She is alert and oriented to person, place, and time.       Motor intact bil steady gait.   Skin: Skin is warm and dry. No rash noted.  Psychiatric: She has a normal mood and affect.    ED Course  Procedures (  including critical care time)  Results for orders placed during the hospital encounter of 10/20/10  POCT PREGNANCY, URINE      Component Value Range   Preg Test, Ur NEGATIVE    URINALYSIS, ROUTINE W REFLEX MICROSCOPIC      Component Value Range   Color, Urine YELLOW  YELLOW    APPearance CLOUDY (*) CLEAR    Specific Gravity, Urine 1.019  1.005 - 1.030    pH 7.5  5.0 - 8.0    Glucose, UA NEGATIVE  NEGATIVE (mg/dL)   Hgb urine dipstick LARGE (*) NEGATIVE     Bilirubin Urine NEGATIVE  NEGATIVE    Ketones, ur NEGATIVE  NEGATIVE (mg/dL)   Protein, ur 30 (*) NEGATIVE (mg/dL)   Urobilinogen, UA 1.0  0.0 - 1.0 (mg/dL)   Nitrite POSITIVE (*) NEGATIVE    Leukocytes, UA SMALL (*) NEGATIVE   URINE MICROSCOPIC-ADD ON      Component Value Range   Squamous Epithelial / LPF MANY (*) RARE    WBC, UA 3-6  <3 (WBC/hpf)   RBC / HPF 11-20  <3 (RBC/hpf)   Bacteria, UA MANY (*) RARE    Urine-Other MUCOUS PRESENT    DIFFERENTIAL      Component Value Range   Neutrophils Relative 78 (*) 43 - 77 (%)   Neutro Abs 9.8 (*) 1.7 - 7.7 (K/uL)   Lymphocytes Relative 16  12 - 46 (%)   Lymphs Abs 2.0  0.7 - 4.0 (K/uL)   Monocytes Relative 5  3 - 12 (%)   Monocytes Absolute 0.7  0.1 - 1.0 (K/uL)   Eosinophils Relative 1  0 - 5 (%)   Eosinophils Absolute 0.1  0.0 - 0.7 (K/uL)   Basophils Relative 0  0 - 1 (%)   Basophils Absolute 0.0  0.0 - 0.1 (K/uL)  CBC      Component Value Range   WBC 12.6 (*) 4.0 - 10.5 (K/uL)   RBC 4.42  3.87 - 5.11 (MIL/uL)   Hemoglobin 14.2  12.0 - 15.0 (g/dL)   HCT 98.3  38.2 - 50.5 (%)   MCV 90.5  78.0 - 100.0 (fL)   MCH 32.1  26.0 - 34.0 (pg)   MCHC 35.5  30.0 - 36.0 (g/dL)   RDW 39.7  67.3 - 41.9 (%)   Platelets 268  150 - 400 (K/uL)  URINE RAPID DRUG SCREEN (HOSP PERFORMED)      Component Value Range   Opiates NONE DETECTED  NONE DETECTED    Cocaine NONE DETECTED  NONE DETECTED    Benzodiazepines NONE DETECTED  NONE DETECTED    Amphetamines NONE DETECTED  NONE DETECTED    Tetrahydrocannabinol POSITIVE (*) NONE DETECTED    Barbiturates NONE DETECTED  NONE DETECTED   COMPREHENSIVE METABOLIC PANEL      Component Value Range   Sodium 145  135 - 145 (mEq/L)   Potassium 3.8  3.5 - 5.1 (mEq/L)   Chloride 108  96 - 112 (mEq/L)   CO2 24  19 - 32 (mEq/L)   Glucose, Bld 142 (*) 70 - 99 (mg/dL)   BUN 6  6 - 23 (mg/dL)   Creatinine, Ser 3.79 (*) 0.50 - 1.10 (mg/dL)   Calcium 9.6  8.4 - 02.4 (mg/dL)   Total Protein 7.5  6.0 - 8.3  (g/dL)   Albumin 4.4  3.5 - 5.2 (g/dL)   AST 25  0 - 37 (U/L)   ALT 14  0 - 35 (U/L)  Alkaline Phosphatase 42  39 - 117 (U/L)   Total Bilirubin 0.6  0.3 - 1.2 (mg/dL)   GFR calc non Af Amer >60  >60 (mL/min)   GFR calc Af Amer >60  >60 (mL/min)  ETHANOL      Component Value Range   Alcohol, Ethyl (B) <11  0 - 11 (mg/dL)  LIPASE, BLOOD      Component Value Range   Lipase 18  11 - 59 (U/L)  WET PREP, GENITAL      Component Value Range   Yeast Wet Prep HPF POC NONE SEEN  NONE SEEN    Trich, Wet Prep NONE SEEN  NONE SEEN    Clue Cells Wet Prep HPF POC FEW (*) NONE SEEN    WBC, Wet Prep HPF POC RARE (*) NONE SEEN   GC/CHLAMYDIA PROBE AMP, GENITAL      Component Value Range   GC Probe Amp, Genital NEGATIVE  NEGATIVE    Chlamydia, DNA Probe NEGATIVE  NEGATIVE   URINE CULTURE      Component Value Range   Specimen Description URINE, RANDOM     Special Requests NONE     Culture  Setup Time [id number]     Colony Count >=100,000 COLONIES/ML     Culture ESCHERICHIA COLI     Report Status 10/22/2010 FINAL     Organism ID, Bacteria ESCHERICHIA COLI     Ct Head Wo Contrast  06/24/2011  *RADIOLOGY REPORT*  Clinical Data: Status post fall, with injury to the head; blood arising from the left external auditory canal.  CT HEAD WITHOUT CONTRAST  Technique:  Contiguous axial images were obtained from the base of the skull through the vertex without contrast.  Comparison: CT of the head performed 01/27/2005  Findings:  There is suggestion of a tiny amount of blood adjacent to a few foci of pneumocephalus overlying the left inferior parieto- occipital region.  This may be subdural in nature, though it is difficult to fully characterize.  Minimal scattered pneumocephalus is noted overlying the left cerebral hemisphere.  The fracture appears to extend across the anterior aspect of the left mastoid air cells, with minimal associated fluid in the left mastoid air cells, and mild debris noted along the left  external auditory canal.  The fracture may also extend superiorly along the left parietal calvarium, explaining the minimal pneumocephalus.  There is no definite evidence of extension to the more central base of the skull.  There is no additional evidence for intra or extra-axial hemorrhage.  No mass lesions are seen.  There is no evidence of acute infarct.  Incidental note is made of chronic encephalomalacia at the inferior right frontal lobe, and to a lesser extent at the inferior left frontal lobe.  The posterior fossa, including the cerebellum, brainstem and fourth ventricle, is within normal limits.  The third and lateral ventricles, and basal ganglia are unremarkable in appearance.  No mass effect or midline shift is seen.  The visualized portions of the orbits are within normal limits. The paranasal sinuses and right mastoid air cells are well-aerated. Mild soft tissue swelling is noted posteriorly near the vertex.  IMPRESSION:  1.  Suggestion of tiny amount of blood adjacent to a few foci of pneumocephalus overlying the left inferior parieto-occipital region.  This may be subdural in nature, though it is difficult to fully characterize. 2.  Minimal scattered pneumocephalus overlies the left cerebral hemisphere; the associated fracture appears to extend across the anterior aspect of the  left mastoid air cells, with minimal associated fluid in the left mastoid air cells, and mild associated debris noted along the left external auditory canal.  The fracture may also extend superiorly along the left parietal calvarium.  No definite evidence for central extension of the fracture. 3.  Mild soft tissue swelling noted posteriorly near the vertex.  4.  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  Critical Value/emergent results were called by telephone at the time of interpretation on 06/24/2011  at 07:02 a.m.  to  Dr. Cathren Laine,  who verbally acknowledged these results.  Original Report Authenticated By: Tonia Ghent, M.D.       MDM  Ct.   Discussed ct w dr Lovell Sheehan - he will see, requests transfer to 3100, or stepdown unit at [facility].  Zofran, antivert.   Ancef iv.   Discussed ct and plan w pt.   Recheck, no neck pain, spine non tender.     Suzi Roots, MD 06/24/11 [id number]  Suzi Roots, MD 06/24/11 [phone]

## 2011-06-25 ENCOUNTER — Inpatient Hospital Stay (HOSPITAL_COMMUNITY): Payer: Self-pay

## 2011-06-25 ENCOUNTER — Encounter (HOSPITAL_COMMUNITY): Payer: Self-pay | Admitting: Radiology

## 2011-06-25 MED ORDER — PROMETHAZINE HCL 12.5 MG PO TABS: 12.5000 mg | ORAL_TABLET | Freq: Four times a day (QID) | ORAL | Status: DC | PRN

## 2011-06-25 MED ORDER — HYDROCODONE-ACETAMINOPHEN 5-325 MG PO TABS: 2.0000 | ORAL_TABLET | ORAL | Status: AC | PRN
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
## 2011-06-25 NOTE — Progress Notes (Addendum)
06/25/2011 9:12 AM  Pt able to d/c per MD order. Pt given prescriptions for Vicodin & Phenergan, instructed regarding use. Pt also given AVS with full discharge instructions and resources and copy placed in chart. Pt's questions answered. Pt d/c via wheelchair by NT to home with ride at 09:45.   Holly Bodily

## 2012-04-20 ENCOUNTER — Encounter (HOSPITAL_COMMUNITY): Payer: Self-pay | Admitting: *Deleted

## 2012-04-20 ENCOUNTER — Emergency Department (INDEPENDENT_AMBULATORY_CARE_PROVIDER_SITE_OTHER)
Admission: EM | Admit: 2012-04-20 | Discharge: 2012-04-20 | Disposition: A | Discharge status: Home / Self Care | Payer: Self-pay | Source: Home / Self Care

## 2012-04-20 ENCOUNTER — Other Ambulatory Visit (HOSPITAL_COMMUNITY)
Admission: RE | Admit: 2012-04-20 | Discharge: 2012-04-20 | Disposition: A | Discharge status: Home / Self Care | Payer: Self-pay | Source: Ambulatory Visit | Attending: Internal Medicine | Admitting: Internal Medicine

## 2012-04-20 DIAGNOSIS — N949 Unspecified condition associated with female genital organs and menstrual cycle: Secondary | ICD-10-CM

## 2012-04-20 DIAGNOSIS — Z113 Encounter for screening for infections with a predominantly sexual mode of transmission: Secondary | ICD-10-CM | POA: Insufficient documentation

## 2012-04-20 DIAGNOSIS — R102 Pelvic and perineal pain: Secondary | ICD-10-CM

## 2012-04-20 DIAGNOSIS — N760 Acute vaginitis: Secondary | ICD-10-CM | POA: Insufficient documentation

## 2012-04-20 DIAGNOSIS — B9689 Other specified bacterial agents as the cause of diseases classified elsewhere: Secondary | ICD-10-CM

## 2012-04-20 DIAGNOSIS — A499 Bacterial infection, unspecified: Secondary | ICD-10-CM

## 2012-04-20 LAB — POCT URINALYSIS DIP (DEVICE)
Leukocytes, UA: NEGATIVE
Nitrite: NEGATIVE
Protein, ur: NEGATIVE mg/dL
pH: 6 (ref 5.0–8.0)

## 2012-04-20 MED ORDER — METRONIDAZOLE 500 MG PO TABS: 500.0000 mg | ORAL_TABLET | Freq: Two times a day (BID) | ORAL | Status: DC

## 2012-04-20 NOTE — ED Provider Notes (Signed)
Medical screening examination/treatment/procedure(s) were performed by resident physician or non-physician practitioner and as supervising physician I was immediately available for consultation/collaboration.   KINDL,JAMES DOUGLAS MD.   James D Kindl, MD 04/20/12 1828 

## 2012-04-20 NOTE — ED Notes (Signed)
Patient complains of discomfort while void and feelings of swelling after vaginal exam last Friday night.

## 2012-04-20 NOTE — ED Provider Notes (Signed)
History     CSN: [id number]  Arrival date & time 04/20/12  1548   None     Chief Complaint  Patient presents with  . Vaginal Pain    (Consider location/radiation/quality/duration/timing/severity/associated sxs/prior treatment) HPI Comments: 32 year old female presents with vaginal pain and swelling 5 days status post attack smear. In the morning she had a Pap smear that night she had sexual intercourse. Following day she developed the above symptoms. She also has "tingling" after pain. She states it feels more like an irritation in the past UTIs she denies dysuria or urinary frequency. She does have a history of bacterial vaginosis. Denies fever, pelvic pain or abdominal pain.  Patient is a 32 y.o. female presenting with vaginal pain.  Vaginal Pain    History reviewed. No pertinent past medical history.  History reviewed. No pertinent past surgical history.  No family history on file.  History  Substance Use Topics  . Smoking status: Current Every Day Smoker -- 0.50 packs/day for 15 years    Types: Cigarettes  . Smokeless tobacco: Not on file  . Alcohol Use: No    OB History   Grav Para Term Preterm Abortions TAB SAB Ect Mult Living                  Review of Systems  Constitutional: Negative.   Respiratory: Negative.   Cardiovascular: Negative.   Gastrointestinal: Negative.   Genitourinary: Positive for vaginal pain. Negative for dysuria, urgency, frequency, hematuria, flank pain, vaginal discharge, menstrual problem, pelvic pain and dyspareunia.  Musculoskeletal: Negative.   Neurological: Negative.   Psychiatric/Behavioral: Negative.     Allergies  Sulfa antibiotics  Home Medications   Current Outpatient Rx  Name  Route  Sig  Dispense  Refill  . ibuprofen (ADVIL,MOTRIN) 200 MG tablet   Oral   Take 400 mg by mouth every 6 (six) hours as needed. Pain         . EXPIRED: promethazine (PHENERGAN) 12.5 MG tablet   Oral   Take 1 tablet (12.5 mg total) by  mouth every 6 (six) hours as needed for nausea.   30 tablet   1     BP 134/88  Pulse 77  Temp(Src) 98.7 F (37.1 C) (Oral)  SpO2 97%  LMP 04/13/2012  Physical Exam  Nursing note and vitals reviewed. Constitutional: She is oriented to person, place, and time. She appears well-developed. No distress.  Eyes: Conjunctivae and EOM are normal.  Neck: Normal range of motion. Neck supple.  Cardiovascular: Normal rate and normal heart sounds.   Pulmonary/Chest: Effort normal and breath sounds normal. No respiratory distress.  Abdominal: Soft. Bowel sounds are normal. She exhibits no distension and no mass. There is no rebound and no guarding.  Genitourinary: Vaginal discharge found.  The vaginal tenderness is located at a point of the introitus at the 5:00 position. It involves a very small portion of the vaginal wall. It is flesh-colored and without apparent inflammation, swelling or discoloration. No associated lesions. Vaginal walls are coated with a thin creamy white to gray vaginal discharge. Despite repeated attempts unable to locate the cervix.  Musculoskeletal: Normal range of motion. She exhibits no edema.  Neurological: She is alert and oriented to person, place, and time. She exhibits normal muscle tone.  Skin: Skin is warm and dry.  Psychiatric: She has a normal mood and affect.    ED Course  Procedures (including critical care time)  Labs Reviewed  POCT URINALYSIS DIP (DEVICE) - Abnormal;  Notable for the following:    Hgb urine dipstick TRACE (*)    All other components within normal limits  POCT PREGNANCY, URINE   No results found.  Results for orders placed during the hospital encounter of 04/20/12  POCT URINALYSIS DIP (DEVICE)      Result Value Range   Glucose, UA NEGATIVE  NEGATIVE mg/dL   Bilirubin Urine NEGATIVE  NEGATIVE   Ketones, ur NEGATIVE  NEGATIVE mg/dL   Specific Gravity, Urine 1.015  1.005 - 1.030   Hgb urine dipstick TRACE (*) NEGATIVE   pH 6.0  5.0  - 8.0   Protein, ur NEGATIVE  NEGATIVE mg/dL   Urobilinogen, UA 0.2  0.0 - 1.0 mg/dL   Nitrite NEGATIVE  NEGATIVE   Leukocytes, UA NEGATIVE  NEGATIVE  POCT PREGNANCY, URINE      Result Value Range   Preg Test, Ur NEGATIVE  NEGATIVE      1. BV (bacterial vaginosis)   2. Vulvar pain       MDM  No vaginal anomalies observed. The area of pain is approximately 4 mm. No abnormal lesions or tissue is observed. The vaginal discharge is consistent with BV. Given her history of past BV infections we will treat with Flagyl 500 mg twice a day for 7 days. Recommend no coitus for at least a week. Obtained swabs for infection testing.         Hayden Rasmussen, NP 04/20/12 [phone]

## 2012-04-22 NOTE — ED Notes (Signed)
GC/Chlamydia neg., Affirm: Gardner pos. Candida and Trich neg., Pt. adequately treated with Flagyl. Shannon Landry 04/22/2012

## 2012-05-07 ENCOUNTER — Other Ambulatory Visit: Payer: Self-pay | Admitting: *Deleted

## 2012-05-07 MED ORDER — CLONAZEPAM 0.5 MG PO TABS: 0.5000 mg | ORAL_TABLET | Freq: Two times a day (BID) | ORAL | Status: DC

## 2012-05-09 ENCOUNTER — Other Ambulatory Visit: Payer: Self-pay | Admitting: *Deleted

## 2013-01-09 ENCOUNTER — Encounter (HOSPITAL_COMMUNITY): Payer: Self-pay | Admitting: Emergency Medicine

## 2013-01-09 ENCOUNTER — Other Ambulatory Visit (HOSPITAL_COMMUNITY)
Admission: RE | Admit: 2013-01-09 | Discharge: 2013-01-09 | Disposition: A | Discharge status: Home / Self Care | Payer: Self-pay | Source: Ambulatory Visit | Attending: Family Medicine | Admitting: Family Medicine

## 2013-01-09 ENCOUNTER — Emergency Department (INDEPENDENT_AMBULATORY_CARE_PROVIDER_SITE_OTHER)
Admission: EM | Admit: 2013-01-09 | Discharge: 2013-01-09 | Disposition: A | Discharge status: Home / Self Care | Payer: Self-pay | Source: Home / Self Care

## 2013-01-09 DIAGNOSIS — N949 Unspecified condition associated with female genital organs and menstrual cycle: Secondary | ICD-10-CM

## 2013-01-09 DIAGNOSIS — A499 Bacterial infection, unspecified: Secondary | ICD-10-CM

## 2013-01-09 DIAGNOSIS — Z113 Encounter for screening for infections with a predominantly sexual mode of transmission: Secondary | ICD-10-CM | POA: Insufficient documentation

## 2013-01-09 DIAGNOSIS — B9689 Other specified bacterial agents as the cause of diseases classified elsewhere: Secondary | ICD-10-CM

## 2013-01-09 DIAGNOSIS — N760 Acute vaginitis: Secondary | ICD-10-CM | POA: Insufficient documentation

## 2013-01-09 MED ORDER — CLINDAMYCIN PHOSPHATE 2 % VA CREA: 1.0000 | TOPICAL_CREAM | Freq: Every day | VAGINAL | Status: DC

## 2013-01-09 MED ORDER — METRONIDAZOLE 500 MG PO TABS: 500.0000 mg | ORAL_TABLET | Freq: Two times a day (BID) | ORAL | Status: DC

## 2013-01-09 NOTE — ED Provider Notes (Signed)
Shannon Landry is a 32 y.o. female who presents to Urgent Care today for vaginal discharge and odor. Patient has had one week of vaginal discharge. She has a history of frequent recurring bacterial vaginosis. Her current discharge of similar to previous episodes of bacterial vaginosis. She recently completed a course of metronidazole pills. She just stopped smoking but does not douche or use any irritating soaps or lubricants.  She denies any abdominal pain fevers chills nausea vomiting or diarrhea. She feels well otherwise.   History reviewed. No pertinent past medical history. History  Substance Use Topics  . Smoking status: Current Every Day Smoker -- 0.50 packs/day for 15 years    Types: Cigarettes  . Smokeless tobacco: Not on file  . Alcohol Use: No   ROS as above Medications reviewed. No current facility-administered medications for this encounter.   Current Outpatient Prescriptions  Medication Sig Dispense Refill  . clindamycin (CLEOCIN) 2 % vaginal cream Place 1 Applicatorful vaginally at bedtime.  40 g  2  . ibuprofen (ADVIL,MOTRIN) 200 MG tablet Take 400 mg by mouth every 6 (six) hours as needed. Pain      . metroNIDAZOLE (FLAGYL) 500 MG tablet Take 1 tablet (500 mg total) by mouth 2 (two) times daily.  14 tablet  0  . [DISCONTINUED] promethazine (PHENERGAN) 12.5 MG tablet Take 1 tablet (12.5 mg total) by mouth every 6 (six) hours as needed for nausea.  30 tablet  1    Exam:  BP 131/76  Pulse 85  Temp(Src) 98.6 F (37 C) (Oral)  Resp 16  SpO2 99%  LMP 12/18/2012 Gen: Well NAD HEENT: EOMI,  MMM Lungs: Normal work of breathing. CTABL Heart: RRR no MRG Abd: NABS, Soft. NT, ND Exts: Non edematous BL  LE, warm and well perfused.  GYN: Normal external genitalia. Nontender Vaginal canal with thin white to yellowish discharge. Cervix is normal appearing and nontender.  Urine pregnancy test is negative  Assessment and Plan: 32 y.o. female with bacterial  vaginosis. Cytology for gonorrhea, Chlamydia, trichomonas, BV and yeast are pending. Treat empirically with oral metronidazole and vaginal clindamycin. Followup with gynecology. Discussed warning signs or symptoms. Please see discharge instructions. Patient expresses understanding.      Rodolph Bong, MD 01/09/13 [phone]

## 2013-01-09 NOTE — ED Notes (Signed)
C/o vaginal discharge with odor x 2 days. Hx of recurrent BV.  Also c/o small cyst that she noticed last night. otc meds no relief.

## 2013-01-29 ENCOUNTER — Emergency Department (INDEPENDENT_AMBULATORY_CARE_PROVIDER_SITE_OTHER)
Admission: EM | Admit: 2013-01-29 | Discharge: 2013-01-29 | Disposition: A | Discharge status: Home / Self Care | Payer: Self-pay | Source: Home / Self Care | Attending: Emergency Medicine | Admitting: Emergency Medicine

## 2013-01-29 ENCOUNTER — Encounter (HOSPITAL_COMMUNITY): Payer: Self-pay | Admitting: Emergency Medicine

## 2013-01-29 DIAGNOSIS — N764 Abscess of vulva: Secondary | ICD-10-CM

## 2013-01-29 MED ORDER — DOXYCYCLINE HYCLATE 100 MG PO CAPS: 100.0000 mg | ORAL_CAPSULE | Freq: Two times a day (BID) | ORAL | Status: DC

## 2013-01-29 NOTE — ED Provider Notes (Signed)
Medical screening examination/treatment/procedure(s) were performed by non-physician practitioner and as supervising physician I was immediately available for consultation/collaboration.  Demarie Hyneman, M.D.  Esias Mory C Katoya Amato, MD 01/29/13 2043 

## 2013-01-29 NOTE — ED Notes (Signed)
Has had 1 week pain in area.  Was here one week ago and was tx for bacterial vaginosis.  States she has vaginal discharge again.

## 2013-01-29 NOTE — ED Provider Notes (Signed)
CSN: [id number]     Arrival date & time 01/29/13  1254 History   First MD Initiated Contact with Patient 01/29/13 1512     Chief Complaint  Patient presents with  . Recurrent Skin Infections    1 week hx of large "bump" between vaginal area and buttock area.     (Consider location/radiation/quality/duration/timing/severity/associated sxs/prior Treatment) HPI Comments: Pt reports vaginal discharge is the same as when she was here on 01/09/13. Lump in L outer labia/vulva was present on 11/24, but very small and not painful. In last 2-3 days, it has become larger and painful. Has had similar sx twice previously.   Patient is a 32 y.o. female presenting with vaginal discharge. The history is provided by the patient.  Vaginal Discharge Quality:  White Severity:  Mild Onset quality:  Gradual Duration:  1 week Timing:  Constant Progression:  Unchanged Chronicity:  Recurrent Relieved by: recently used clindamycin vaginal cream and flagyl. Worsened by:  Nothing tried Associated symptoms: genital lesions   Associated symptoms: no fever   Genital lesions:    Location:  Vulva   Severity:  Moderate   Onset quality:  Gradual   Duration:  3 weeks   Timing:  Constant   Progression:  Worsening   Chronicity:  New   History reviewed. No pertinent past medical history. History reviewed. No pertinent past surgical history. History reviewed. No pertinent family history. History  Substance Use Topics  . Smoking status: Current Every Day Smoker -- 0.50 packs/day for 15 years    Types: Cigarettes  . Smokeless tobacco: Not on file  . Alcohol Use: No   OB History   Grav Para Term Preterm Abortions TAB SAB Ect Mult Living                 Review of Systems  Constitutional: Negative for fever and chills.  Genitourinary: Positive for vaginal discharge. Negative for vaginal bleeding and vaginal pain.    Allergies  Sulfa antibiotics  Home Medications   Current Outpatient Rx  Name  Route   Sig  Dispense  Refill  . clindamycin (CLEOCIN) 2 % vaginal cream   Vaginal   Place 1 Applicatorful vaginally at bedtime.   40 g   2   . doxycycline (VIBRAMYCIN) 100 MG capsule   Oral   Take 1 capsule (100 mg total) by mouth 2 (two) times daily.   20 capsule   0   . ibuprofen (ADVIL,MOTRIN) 200 MG tablet   Oral   Take 400 mg by mouth every 6 (six) hours as needed. Pain         . metroNIDAZOLE (FLAGYL) 500 MG tablet   Oral   Take 1 tablet (500 mg total) by mouth 2 (two) times daily.   14 tablet   0    BP 137/88  Pulse 72  Temp(Src) 98.6 F (37 C) (Oral)  Resp 18  SpO2 99%  LMP 12/18/2012 Physical Exam  Constitutional: She appears well-developed and well-nourished. No distress.  Genitourinary:    There is no rash, tenderness, lesion or injury on the right labia. There is tenderness and lesion on the left labia. There is no rash or injury on the left labia.    ED Course  Procedures (including critical care time) Labs Review Labs Reviewed - No data to display Imaging Review No results found.  EKG Interpretation    Date/Time:    Ventricular Rate:    PR Interval:    QRS Duration:  QT Interval:    QTC Calculation:   R Axis:     Text Interpretation:              MDM   1. Vulvar abscess   Most likely bartholin's gland cyst. Is too deep to be lanced here in [facility]. Rx doxycycline 100mg  BID #20. Pt to do warm compresses/sitz baths at least BID. Return if sx worsen or do not resolve. REviewed 01/09/13 visit labs. Pt was negative for gc/chlamydia/BV/yeast/trich. As pt reports vag d/c is same today as then; deferred vag exam since sx not new or changed. Encouraged pt to f/u with gyn. Pt is getting insurance 02/16/13. Will f/u then.      Cathlyn Parsons, NP 01/29/13 1524

## 2013-02-12 ENCOUNTER — Encounter (HOSPITAL_COMMUNITY): Payer: Self-pay | Admitting: Emergency Medicine

## 2013-02-12 ENCOUNTER — Emergency Department (HOSPITAL_COMMUNITY): Payer: Self-pay

## 2013-02-12 ENCOUNTER — Inpatient Hospital Stay (HOSPITAL_COMMUNITY)
Admission: EM | Admit: 2013-02-12 | Discharge: 2013-02-12 | DRG: 087 | Discharge status: Against Medical Advice | Payer: Self-pay | Attending: General Surgery | Admitting: General Surgery

## 2013-02-12 DIAGNOSIS — IMO0002 Reserved for concepts with insufficient information to code with codable children: Secondary | ICD-10-CM | POA: Diagnosis present

## 2013-02-12 DIAGNOSIS — S02109A Fracture of base of skull, unspecified side, initial encounter for closed fracture: Principal | ICD-10-CM | POA: Diagnosis present

## 2013-02-12 DIAGNOSIS — F17200 Nicotine dependence, unspecified, uncomplicated: Secondary | ICD-10-CM | POA: Diagnosis present

## 2013-02-12 DIAGNOSIS — S0990XA Unspecified injury of head, initial encounter: Secondary | ICD-10-CM

## 2013-02-12 DIAGNOSIS — G9389 Other specified disorders of brain: Secondary | ICD-10-CM | POA: Diagnosis present

## 2013-02-12 DIAGNOSIS — S0219XA Other fracture of base of skull, initial encounter for closed fracture: Secondary | ICD-10-CM

## 2013-02-12 LAB — CBC WITH DIFFERENTIAL/PLATELET
Basophils Absolute: 0 10*3/uL (ref 0.0–0.1)
Eosinophils Absolute: 0 10*3/uL (ref 0.0–0.7)
Eosinophils Relative: 0 % (ref 0–5)
HCT: 36.5 % (ref 36.0–46.0)
Lymphocytes Relative: 6 % — ABNORMAL LOW (ref 12–46)
MCHC: 34.8 g/dL (ref 30.0–36.0)
MCV: 91.9 fL (ref 78.0–100.0)
Platelets: 274 10*3/uL (ref 150–400)
RDW: 12.2 % (ref 11.5–15.5)
WBC: 23 10*3/uL — ABNORMAL HIGH (ref 4.0–10.5)

## 2013-02-12 LAB — BASIC METABOLIC PANEL
BUN: 11 mg/dL (ref 6–23)
Calcium: 8.8 mg/dL (ref 8.4–10.5)
Creatinine, Ser: 0.64 mg/dL (ref 0.50–1.10)
GFR calc non Af Amer: >90 mL/min (ref >90)
Sodium: 142 mEq/L (ref 135–145)

## 2013-02-12 MED ORDER — PANTOPRAZOLE SODIUM 40 MG IV SOLR: 40.0000 mg | Freq: Every day | INTRAVENOUS | Status: DC

## 2013-02-12 MED ORDER — HYDROMORPHONE HCL PF 1 MG/ML IJ SOLN
1.0000 mg | INTRAMUSCULAR | Status: AC
  Administered 2013-02-12: 1 mg via INTRAVENOUS
  Filled 2013-02-12: qty 1

## 2013-02-12 MED ORDER — ONDANSETRON HCL 4 MG PO TABS: 4.0000 mg | ORAL_TABLET | Freq: Four times a day (QID) | ORAL | Status: DC | PRN

## 2013-02-12 MED ORDER — SODIUM CHLORIDE 0.9 % IV BOLUS (SEPSIS)
1000.0000 mL | Freq: Once | INTRAVENOUS | Status: AC
  Administered 2013-02-12: 1000 mL via INTRAVENOUS

## 2013-02-12 MED ORDER — PROMETHAZINE HCL 25 MG/ML IJ SOLN: 12.5000 mg | INTRAMUSCULAR | Status: DC | PRN

## 2013-02-12 MED ORDER — ACETAMINOPHEN 325 MG PO TABS: 650.0000 mg | ORAL_TABLET | ORAL | Status: DC | PRN

## 2013-02-12 MED ORDER — HYDROMORPHONE HCL PF 1 MG/ML IJ SOLN
1.0000 mg | INTRAMUSCULAR | Status: AC
Start: 2013-02-12 — End: 2013-02-12
  Administered 2013-02-12: 1 mg via INTRAVENOUS
  Filled 2013-02-12: qty 1

## 2013-02-12 MED ORDER — PANTOPRAZOLE SODIUM 40 MG PO TBEC
40.0000 mg | DELAYED_RELEASE_TABLET | Freq: Every day | ORAL | Status: DC
  Administered 2013-02-12: 40 mg via ORAL
  Filled 2013-02-12: qty 1

## 2013-02-12 MED ORDER — OXYCODONE HCL 5 MG PO TABS
10.0000 mg | ORAL_TABLET | ORAL | Status: DC | PRN
  Administered 2013-02-12: 10 mg via ORAL
  Filled 2013-02-12 (×2): qty 2

## 2013-02-12 MED ORDER — CIPROFLOXACIN-DEXAMETHASONE 0.3-0.1 % OT SUSP
3.0000 [drp] | Freq: Three times a day (TID) | OTIC | Status: DC
  Administered 2013-02-12 (×2): 3 [drp] via OTIC
  Filled 2013-02-12 (×2): qty 7.5

## 2013-02-12 MED ORDER — TETANUS-DIPHTH-ACELL PERTUSSIS 5-2.5-18.5 LF-MCG/0.5 IM SUSP
0.5000 mL | Freq: Once | INTRAMUSCULAR | Status: AC
  Administered 2013-02-12: 0.5 mL via INTRAMUSCULAR
  Filled 2013-02-12: qty 0.5

## 2013-02-12 MED ORDER — PROMETHAZINE HCL 25 MG/ML IJ SOLN
12.5000 mg | INTRAMUSCULAR | Status: DC | PRN
  Administered 2013-02-12: 12.5 mg via INTRAMUSCULAR
  Filled 2013-02-12 (×2): qty 1

## 2013-02-12 MED ORDER — ONDANSETRON HCL 4 MG/2ML IJ SOLN
4.0000 mg | Freq: Once | INTRAMUSCULAR | Status: AC
  Administered 2013-02-12: 4 mg via INTRAVENOUS
  Filled 2013-02-12: qty 2

## 2013-02-12 MED ORDER — ONDANSETRON HCL 4 MG/2ML IJ SOLN: 4.0000 mg | Freq: Four times a day (QID) | INTRAMUSCULAR | Status: DC | PRN

## 2013-02-12 MED ORDER — OXYCODONE HCL 5 MG PO TABS: 5.0000 mg | ORAL_TABLET | ORAL | Status: DC | PRN

## 2013-02-12 MED ORDER — POTASSIUM CHLORIDE IN NACL 20-0.9 MEQ/L-% IV SOLN
INTRAVENOUS | Status: DC
  Filled 2013-02-12 (×2): qty 1000

## 2013-02-12 MED ORDER — PROMETHAZINE HCL 25 MG/ML IJ SOLN
25.0000 mg | Freq: Once | INTRAMUSCULAR | Status: DC
  Filled 2013-02-12: qty 1

## 2013-02-12 MED ORDER — HYDROMORPHONE HCL PF 1 MG/ML IJ SOLN: 0.5000 mg | INTRAMUSCULAR | Status: DC | PRN

## 2013-02-12 NOTE — Progress Notes (Signed)
Patient refused to remove facial piercings for CT imaging Patient attempting to make herself vomit in CT-

## 2013-02-12 NOTE — ED Notes (Signed)
Pt. Found sticking her finger down throat to induce vomiting because she was nauseated.

## 2013-02-12 NOTE — Progress Notes (Signed)
Patient settled into 4N21. No IV access as she pulled out both IVs in the ED. Changed her gown, explained the bed alarm policy, explained the call bell. Set up password [place]). Patient stating injuries are from falling down the stairs. Dried blood noted in left ear canal with swelling. Patient arrived crying, complaining of nausea and pain. Both pain meds and nausea meds have been given. Patient resting comfortably, attempting to sleep now. Will cont to monitor

## 2013-02-12 NOTE — ED Notes (Signed)
Pt returns from ct scan. 
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
## 2013-02-12 NOTE — ED Notes (Signed)
Pt pulled out IV site.

## 2013-02-12 NOTE — ED Notes (Signed)
PT observed sticking her 2inch fake finger nails in to the back of throat. Pt instructed not to do it but continued because " I feel like i need to throw up".
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
## 2013-02-12 NOTE — ED Notes (Signed)
Pt has abrasions on the Right lower back and left buttock.  Pt rates pain 10/10.  MD at bedside.  Pt denies neck pain.  Pt states she has pain in her Left ear and in to her hair line on the left side of her head behind ear.

## 2013-02-12 NOTE — Progress Notes (Signed)
Called MD thompson about patient's ear drainage, patient soaking through about 1 2x2 gauze per hour on her left ear, bloody drainage. He said that is to be expected with her injuries. Will cont to monitor

## 2013-02-12 NOTE — Progress Notes (Signed)
Small amount of bloody drainage on pillow while patient napped on her left ear. Applied ABX ear drops then applied gauze to catch any drainage. Patient then back to sleep.
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
## 2013-02-12 NOTE — Progress Notes (Signed)
Spoke with patient about getting a Technical sales engineer, she chose "[employer]". I told her that whoever she wanted to see her could come see her or call her as long as she gives them the password information, and that no one who did not have the password would be able to find her or get information. Patient stated understanding. Patient is a Education officer, community

## 2013-02-12 NOTE — ED Notes (Signed)
Patient states that she fell down 4-5 steps this morning on the cement steps.  Has bleeding on her left ear, complains of feeling dizzy, nose is bleeding.
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
## 2013-02-15 ENCOUNTER — Emergency Department (HOSPITAL_COMMUNITY): Payer: Self-pay

## 2013-02-15 ENCOUNTER — Encounter (HOSPITAL_COMMUNITY): Payer: Self-pay | Admitting: Emergency Medicine

## 2013-02-15 ENCOUNTER — Emergency Department (HOSPITAL_COMMUNITY)
Admission: EM | Admit: 2013-02-15 | Discharge: 2013-02-15 | Disposition: A | Discharge status: Home / Self Care | Payer: Self-pay | Attending: Emergency Medicine | Admitting: Emergency Medicine

## 2013-02-15 DIAGNOSIS — R29810 Facial weakness: Secondary | ICD-10-CM | POA: Insufficient documentation

## 2013-02-15 DIAGNOSIS — Z792 Long term (current) use of antibiotics: Secondary | ICD-10-CM | POA: Insufficient documentation

## 2013-02-15 DIAGNOSIS — S0219XS Other fracture of base of skull, sequela: Secondary | ICD-10-CM

## 2013-02-15 DIAGNOSIS — F17200 Nicotine dependence, unspecified, uncomplicated: Secondary | ICD-10-CM | POA: Insufficient documentation

## 2013-02-15 DIAGNOSIS — Q674 Other congenital deformities of skull, face and jaw: Secondary | ICD-10-CM | POA: Insufficient documentation

## 2013-02-15 DIAGNOSIS — IMO0001 Reserved for inherently not codable concepts without codable children: Secondary | ICD-10-CM | POA: Insufficient documentation

## 2013-02-15 LAB — POCT I-STAT, CHEM 8
BUN: 5 mg/dL — ABNORMAL LOW (ref 6–23)
Calcium, Ion: 1.19 mmol/L (ref 1.12–1.23)
Chloride: 101 mEq/L (ref 96–112)
HCT: 42 % (ref 36.0–46.0)
Potassium: 3.2 mEq/L — ABNORMAL LOW (ref 3.7–5.3)
Sodium: 142 mEq/L (ref 137–147)
TCO2: 30 mmol/L (ref 0–100)

## 2013-02-15 MED ORDER — CEPHALEXIN 500 MG PO CAPS: 500.0000 mg | ORAL_CAPSULE | Freq: Four times a day (QID) | ORAL | Status: DC

## 2013-02-15 MED ORDER — POTASSIUM CHLORIDE CRYS ER 20 MEQ PO TBCR
40.0000 meq | EXTENDED_RELEASE_TABLET | Freq: Once | ORAL | Status: AC
  Administered 2013-02-15: 40 meq via ORAL
  Filled 2013-02-15: qty 2
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
## 2013-02-16 ENCOUNTER — Encounter (HOSPITAL_COMMUNITY): Payer: Self-pay | Admitting: Emergency Medicine

## 2013-02-16 ENCOUNTER — Emergency Department (HOSPITAL_COMMUNITY)
Admission: EM | Admit: 2013-02-16 | Discharge: 2013-02-16 | Disposition: A | Discharge status: Home / Self Care | Payer: Self-pay | Attending: Emergency Medicine | Admitting: Emergency Medicine

## 2013-02-16 ENCOUNTER — Emergency Department (HOSPITAL_COMMUNITY): Payer: Self-pay

## 2013-02-16 DIAGNOSIS — F17200 Nicotine dependence, unspecified, uncomplicated: Secondary | ICD-10-CM | POA: Insufficient documentation

## 2013-02-16 DIAGNOSIS — Z8781 Personal history of (healed) traumatic fracture: Secondary | ICD-10-CM | POA: Insufficient documentation

## 2013-02-16 DIAGNOSIS — G510 Bell's palsy: Secondary | ICD-10-CM | POA: Insufficient documentation

## 2013-02-16 DIAGNOSIS — Z792 Long term (current) use of antibiotics: Secondary | ICD-10-CM | POA: Insufficient documentation

## 2013-02-16 MED ORDER — IBUPROFEN 400 MG PO TABS
600.0000 mg | ORAL_TABLET | Freq: Once | ORAL | Status: AC
  Administered 2013-02-16: 600 mg via ORAL
  Filled 2013-02-16 (×2): qty 1

## 2013-02-16 MED ORDER — GADOBENATE DIMEGLUMINE 529 MG/ML IV SOLN
15.0000 mL | Freq: Once | INTRAVENOUS | Status: AC
  Administered 2013-02-16: 15 mL via INTRAVENOUS

## 2013-02-16 MED ORDER — OXYCODONE-ACETAMINOPHEN 5-325 MG PO TABS
2.0000 | ORAL_TABLET | Freq: Once | ORAL | Status: AC
  Administered 2013-02-16: 2 via ORAL
  Filled 2013-02-16: qty 2

## 2013-02-16 MED ORDER — ONDANSETRON 4 MG PO TBDP
4.0000 mg | ORAL_TABLET | Freq: Once | ORAL | Status: AC
  Administered 2013-02-16: 4 mg via ORAL
  Filled 2013-02-16: qty 1

## 2013-02-16 MED ORDER — ONDANSETRON 4 MG PO TBDP
8.0000 mg | ORAL_TABLET | Freq: Once | ORAL | Status: AC
  Administered 2013-02-16: 8 mg via ORAL
  Filled 2013-02-16: qty 2

## 2013-02-16 NOTE — ED Provider Notes (Signed)
Care assumed at the change of shift pending MRI brain to further eval temporal bone fx from several days ago as well as new onset facial nerve palsy. MRI results reviewed as below. No acute surgical findings. Patient advised to followup with ENT for further eval as previously recommended.       Mr Laqueta JeanBrain W Wo Contrast  02/16/2013   CLINICAL DATA:  Trauma to the left side of the face 5 days ago. Left facial paralysis.  EXAM: MRI HEAD WITHOUT AND WITH CONTRAST  TECHNIQUE: Multiplanar, multiecho pulse sequences of the brain and surrounding structures were obtained without and with intravenous contrast.  CONTRAST:  15mL MULTIHANCE GADOBENATE DIMEGLUMINE 529 MG/ML IV SOLN  COMPARISON:  CT 02/12/2013  FINDINGS: The patient is known to have a comminuted fracture of the left temporal bone. There is hemorrhagic contusion of the adjacent left temporal lobe. Small hematoma measuring 7-8 mm is noted. The injured brain shows mild swelling but there is no significant mass effect. There is a small amount of subdural hemorrhage along the convexity on the left, no more than 2 mm in thickness. There are a few dots of air within that, better shown at CT.  There are areas of chronic posttraumatic atrophy and encephalomalacia in both frontal lobes and at the right temporal tip. I think there has been some acute re-injury to the frontal lobes. A small amount of acute hemorrhage in the left frontal lobe is present, 6 or 7 mm in size.  No evidence of ischemic infarction. No evidence of mass lesion or hydrocephalus.  Seventh and 8th nerves appear normal within the CP angle regions and within the internal auditory canal. Fluid remains present throughout the mastoid air cells and within the middle ear on the left. Disruption of the facial nerve is not specifically identified, but this nerve would obviously be at considerable risk given the comminuted temporal bone fracture. No evidence of disruption of the cochlea or semi circular  canals.  IMPRESSION: Acute/ subacute hemorrhagic contusion of the left temporal lobe adjacent to the comminuted temporal bone fracture. Regional edema and subcentimeter hematoma.  Thin left subdural hematoma along the convexity, maximally 2 mm in thickness. Few dots of air.  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 Probable recurrent hemorrhagic contusion in the frontal lobes with a 6 or 7 mm hematoma on the left.  Fluid throughout the left mastoid air cells and middle ear. No evidence of disruption of the 7th or 8th nerves in the CP angle regions or internal auditory canal. The 7th nerve would be at considerable risk in the descending portion related to the comminuted temporal bone fracture, but disruption cannot be specifically identified.   Electronically Signed   By: Paulina FusiMark  Shogry M.D.   On: 02/16/2013 12:48     Charles B. Bernette MayersSheldon, MD 02/16/13 1323

## 2013-02-16 NOTE — ED Notes (Signed)
Patient moved to Pod C

## 2013-02-16 NOTE — Discharge Instructions (Signed)
Bell's Palsy  Bell's palsy is a condition in which the muscles on one side of the face cannot move (paralysis). This is because the nerves in the face are paralyzed. It is most often thought to be caused by a virus. The virus causes swelling of the nerve that controls movement on one side of the face. The nerve travels through a tight space surrounded by bone. When the nerve swells, it can be compressed by the bone. This results in damage to the protective covering around the nerve. This damage interferes with how the nerve communicates with the muscles of the face. As a result, it can cause weakness or paralysis of the facial muscles.   Injury (trauma), tumor, and surgery may cause Bell's palsy, but most of the time the cause is unknown. It is a relatively common condition. It starts suddenly (abrupt onset) with the paralysis usually ending within 2 days. Bell's palsy is not dangerous. But because the eye does not close properly, you may need care to keep the eye from getting dry. This can include splinting (to keep the eye shut) or moistening with artificial tears. Bell's palsy very seldom occurs on both sides of the face at the same time.  SYMPTOMS    Eyebrow sagging.   Drooping of the eyelid and corner of the mouth.   Inability to close one eye.   Loss of taste on the front of the tongue.   Sensitivity to loud noises.  TREATMENT   The treatment is usually non-surgical. If the patient is seen within the first 24 to 48 hours, a short course of steroids may be prescribed, in an attempt to shorten the length of the condition. Antiviral medicines may also be used with the steroids, but it is unclear if they are helpful.   You will need to protect your eye, if you cannot close it. The cornea (clear covering over your eye) will become dry and can be damaged. Artificial tears can be used to keep your eye moist. Glasses or an eye patch should be worn to protect your eye.  PROGNOSIS   Recovery is variable, ranging  from days to months. Although the problem usually goes away completely (about 80% of cases resolve), predicting the outcome is impossible. Most people improve within 3 weeks of when the symptoms began. Improvement may continue for 3 to 6 months. A small number of people have moderate to severe weakness that is permanent.   HOME CARE INSTRUCTIONS    If your caregiver prescribed medication to reduce swelling in the nerve, use as directed. Do not stop taking the medication unless directed by your caregiver.   Use moisturizing eye drops as needed to prevent drying of your eye, as directed by your caregiver.   Protect your eye, as directed by your caregiver.   Use facial massage and exercises, as directed by your caregiver.   Perform your normal activities, and get your normal rest.  SEEK IMMEDIATE MEDICAL CARE IF:    There is pain, redness or irritation in the eye.   You or your child has an oral temperature above 102 F (38.9 C), not controlled by medicine.  MAKE SURE YOU:    Understand these instructions.   Will watch your condition.   Will get help right away if you are not doing well or get worse.  Document Released: 02/02/2005 Document Revised: 04/27/2011 Document Reviewed: 02/11/2009  ExitCare Patient Information 2014 [employer].

## 2013-02-16 NOTE — ED Notes (Signed)
Pt transported to MRI 
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
## 2013-12-04 ENCOUNTER — Encounter (HOSPITAL_COMMUNITY): Payer: Self-pay | Admitting: Emergency Medicine

## 2013-12-04 ENCOUNTER — Emergency Department (HOSPITAL_COMMUNITY)
Admission: EM | Admit: 2013-12-04 | Discharge: 2013-12-05 | Disposition: A | Discharge status: Home / Self Care | Payer: Self-pay | Attending: Emergency Medicine | Admitting: Emergency Medicine

## 2013-12-04 ENCOUNTER — Emergency Department (HOSPITAL_COMMUNITY)
Admission: EM | Admit: 2013-12-04 | Discharge: 2013-12-04 | Disposition: A | Discharge status: Home / Self Care | Payer: Self-pay | Attending: Emergency Medicine | Admitting: Emergency Medicine

## 2013-12-04 DIAGNOSIS — R1110 Vomiting, unspecified: Secondary | ICD-10-CM | POA: Insufficient documentation

## 2013-12-04 DIAGNOSIS — T360X5A Adverse effect of penicillins, initial encounter: Secondary | ICD-10-CM | POA: Insufficient documentation

## 2013-12-04 DIAGNOSIS — Z87891 Personal history of nicotine dependence: Secondary | ICD-10-CM | POA: Insufficient documentation

## 2013-12-04 DIAGNOSIS — R509 Fever, unspecified: Secondary | ICD-10-CM | POA: Insufficient documentation

## 2013-12-04 DIAGNOSIS — R110 Nausea: Secondary | ICD-10-CM | POA: Insufficient documentation

## 2013-12-04 DIAGNOSIS — T50905A Adverse effect of unspecified drugs, medicaments and biological substances, initial encounter: Secondary | ICD-10-CM

## 2013-12-04 DIAGNOSIS — Z792 Long term (current) use of antibiotics: Secondary | ICD-10-CM | POA: Insufficient documentation

## 2013-12-04 DIAGNOSIS — K029 Dental caries, unspecified: Secondary | ICD-10-CM | POA: Insufficient documentation

## 2013-12-04 DIAGNOSIS — T391X5A Adverse effect of 4-Aminophenol derivatives, initial encounter: Secondary | ICD-10-CM | POA: Insufficient documentation

## 2013-12-04 DIAGNOSIS — K088 Other specified disorders of teeth and supporting structures: Secondary | ICD-10-CM | POA: Insufficient documentation

## 2013-12-04 DIAGNOSIS — T402X5A Adverse effect of other opioids, initial encounter: Secondary | ICD-10-CM | POA: Insufficient documentation

## 2013-12-04 MED ORDER — BUPIVACAINE-EPINEPHRINE (PF) 0.5% -1:200000 IJ SOLN
1.8000 mL | Freq: Once | INTRAMUSCULAR | Status: AC
  Administered 2013-12-04: 1.8 mL

## 2013-12-04 MED ORDER — ONDANSETRON HCL 4 MG PO TABS: 4.0000 mg | ORAL_TABLET | Freq: Four times a day (QID) | ORAL | Status: DC

## 2013-12-04 MED ORDER — ONDANSETRON 8 MG PO TBDP
8.0000 mg | ORAL_TABLET | Freq: Once | ORAL | Status: AC
  Administered 2013-12-04: 8 mg via ORAL
  Filled 2013-12-04: qty 1

## 2013-12-04 MED ORDER — AMOXICILLIN 500 MG PO CAPS: 500.0000 mg | ORAL_CAPSULE | Freq: Three times a day (TID) | ORAL | Status: DC

## 2013-12-04 MED ORDER — OXYCODONE-ACETAMINOPHEN 5-325 MG PO TABS: ORAL_TABLET | ORAL | Status: DC

## 2013-12-04 NOTE — ED Notes (Signed)
Pt given d/c instructions verabally and written. Pt instructed not to operate any motor vehicle while taking the prescription narcotics. Verbalized understanding. NAD noted.

## 2013-12-04 NOTE — ED Provider Notes (Signed)
Medical screening examination/treatment/procedure(s) were conducted as a shared visit with non-physician practitioner(s) and myself.  I personally evaluated the patient during the encounter.   EKG Interpretation None      Dental follow up. Home with pain meds and abx  Lyanne CoKevin M Dejean Tribby, MD 12/04/13 [phone]

## 2013-12-04 NOTE — ED Provider Notes (Signed)
CSN: [id number]     Arrival date & time 12/04/13  0504 History   First MD Initiated Contact with Patient 12/04/13 [phone]     Chief Complaint  Patient presents with  . Dental Pain     (Consider location/radiation/quality/duration/timing/severity/associated sxs/prior Treatment) HPI   Shannon Landry is a 33 y.o. female complaining of severe right dental which keeps her from sleep. Patient took Motrin at home with little weak. She denies fever, chills, difficulty swallowing, swelling  History reviewed. No pertinent past medical history. Past Surgical History  Procedure Laterality Date  . Facial nerve decompression     Family History  Problem Relation Age of Onset  . Diabetes Father    History  Substance Use Topics  . Smoking status: Former Smoker    Quit date: 11/27/2013  . Smokeless tobacco: Current User     Comment: e-cigarettes  . Alcohol Use: No   OB History   Grav Para Term Preterm Abortions TAB SAB Ect Mult Living                 Review of Systems  10 systems reviewed and found to be negative, except as noted in the HPI.   Allergies  Sulfa antibiotics  Home Medications   Prior to Admission medications   Medication Sig Start Date End Date Taking? Authorizing Provider  ibuprofen (ADVIL,MOTRIN) 200 MG tablet Take 600 mg by mouth every 6 (six) hours as needed for moderate pain.   Yes Historical Provider, MD  amoxicillin (AMOXIL) 500 MG capsule Take 1 capsule (500 mg total) by mouth 3 (three) times daily. 12/04/13   Caidance Sybert, PA-C  oxyCODONE-acetaminophen (PERCOCET/ROXICET) 5-325 MG per tablet 1 to 2 tabs PO q6hrs  PRN for pain 12/04/13   Monda Chastain, PA-C   BP 127/83  Pulse 82  Temp(Src) 97.8 F (36.6 C) (Oral)  Resp 18  Ht 5\' 5"  (1.651 m)  Wt 151 lb (68.493 kg)  BMI 25.13 kg/m2  SpO2 100%  LMP 11/07/2013 Physical Exam  Nursing note and vitals reviewed. Constitutional: She is oriented to person, place, and time. She appears well-developed  and well-nourished. No distress.  HENT:  Head: Normocephalic.  Generally poor dentition, no gingival swelling, erythema or tenderness to palpation. Patient is handling their secretions. There is no tenderness to palpation or firmness underneath tongue bilaterally. No trismus.    Eyes: Conjunctivae and EOM are normal.  Cardiovascular: Normal rate.   Pulmonary/Chest: Effort normal. No stridor.  Musculoskeletal: Normal range of motion.  Neurological: She is alert and oriented to person, place, and time.  Psychiatric: She has a normal mood and affect.    ED Course  NERVE BLOCK Date/Time: 12/04/2013 5:40 AM Performed by: Wynetta EmeryPISCIOTTA, Asif Muchow Authorized by: Wynetta EmeryPISCIOTTA, Rebakah Cokley Consent: Verbal consent obtained. Consent given by: patient Patient identity confirmed: verbally with patient Indications: pain relief Body area: face/mouth Nerve: inferior alveolar Laterality: right Patient sedated: no Patient position: sitting Needle gauge: 27 G Local anesthetic: bupivacaine 0.5% with epinephrine Anesthetic total: 1.8 ml Outcome: pain improved Patient tolerance: Patient tolerated the procedure well with no immediate complications.   (including critical care time) Labs Review Labs Reviewed - No data to display  Imaging Review No results found.   EKG Interpretation None      MDM   Final diagnoses:  Pain due to dental caries    Filed Vitals:   12/04/13 0515  BP: 127/83  Pulse: 82  Temp: 97.8 F (36.6 C)  TempSrc: Oral  Resp: 18  Height:  5\' 5"  (1.651 m)  Weight: 151 lb (68.493 kg)  SpO2: 100%    Medications  bupivacaine-epinephrine (MARCAINE W/ EPI) 0.5% -1:200000 injection 1.8 mL (1.8 mLs Infiltration Given 12/04/13 0536)    Shannon Landry is a 33 y.o. female presenting with dental pain associated with dental caries but no signs or symptoms of dental abscess. Patient afebrile, non toxic appearing and swallowing secretions well. I gave patient referral to dentist and  stressed the importance of dental follow up for definitive management of dental issues. Patient voices understanding and is agreeable to plan.  Evaluation does not show pathology that would require ongoing emergent intervention or inpatient treatment. Pt is hemodynamically stable and mentating appropriately. Discussed findings and plan with patient/guardian, who agrees with care plan. All questions answered. Return precautions discussed and outpatient follow up given.   New Prescriptions   AMOXICILLIN (AMOXIL) 500 MG CAPSULE    Take 1 capsule (500 mg total) by mouth 3 (three) times daily.   OXYCODONE-ACETAMINOPHEN (PERCOCET/ROXICET) 5-325 MG PER TABLET    1 to 2 tabs PO q6hrs  PRN for pain         Wynetta Emeryicole Meli Faley, PA-C 12/04/13 [phone]

## 2013-12-04 NOTE — ED Notes (Signed)
Pt reported having toothache on rt lower molar. Noted caries and missing teeth. Mouth/gums pink and moist.

## 2013-12-04 NOTE — Discharge Instructions (Signed)

## 2013-12-04 NOTE — Discharge Instructions (Signed)
Take percocet for breakthrough pain, do not drink alcohol, drive, care for children or do other critical tasks while taking percocet. ° °Return to the emergency room for fever, change in vision, redness to the face that rapidly spreads towards the eye, nausea or vomiting, difficulty swallowing or shortness of breath. °  °Apply warm compresses to jaw throughout the day.  ° ° Take your antibiotics as directed and to the end of the course.  ° °Followup with a dentist is very important for ongoing evaluation and management of recurrent dental pain. Return to emergency department for emergent changing or worsening symptoms." ° °Low-cost dental clinic: °**David  Civils  at [phone]**  °**Janna Civils at [phone] [address]**   ° °You may also call [phone] ° °Dental Assistance °If the dentist on-call cannot see you, please use the resources below: ° ° °Patients with Medicaid: [facility] °[address], [phone] °[address], [phone] ° °If unable to pay, or uninsured, contact [facility] ([phone]) or [facility] ([phone] in [place], [phone] in [place]) to become qualified for the adult dental clinic ° °Other Low-Cost Community Dental Services: °[facility]- [address], [place], [place], [address] °   [phone], Ext. [phone] °   2nd and 4th Thursday of the month at 6:30am °   10 clients each day by appointment, can sometimes see walk-in     patients if someone does not show for an appointment °[facility]- [address], [place], [place], [address] °   [phone] °[facility]- [address], [place], [place], [address] °   [phone] ° °[facility]- [phone] °[facility]- [phone] °[facility]- [phone] ° °

## 2013-12-04 NOTE — ED Provider Notes (Signed)
CSN: [id number]     Arrival date & time 12/04/13  2319 History  This chart was scribed for a non-physician practitioner, Antony MaduraKelly Leonela Kivi, PA-C, working with April Smitty CordsK Palumbo-Rasch, MD by Julian HyMorgan Graham, ED Scribe. The patient was seen in [facility]. The patient's care was started at 11:50 PM.   Chief Complaint  Patient presents with  . Nausea   The history is provided by the patient. No language interpreter was used.   HPI Comments: Shannon Landry is a 33 y.o. female who presents to the Emergency Department complaining of nausea onset three hours ago. She notes she was recently seen in the ED for dental pain and was given Amoxicillin and Percocet. Pt states she took her medication and attempted to go to sleep and vomited shortly after waking. She reports having five episodes of NB/NB emesis. She attempted to eat saltine crackers and drink ginger ale; she has tolerated these without further emesis. She last took her medication seven hours ago. Pt notes she has had similar symptoms with pain medications in the past. She denies any dental pain at this time. She denies hematemesis, abdominal pain, fever.  History reviewed. No pertinent past medical history. Past Surgical History  Procedure Laterality Date  . Facial nerve decompression     Family History  Problem Relation Age of Onset  . Diabetes Father    History  Substance Use Topics  . Smoking status: Former Smoker    Quit date: 11/27/2013  . Smokeless tobacco: Current User     Comment: e-cigarettes  . Alcohol Use: No   OB History   Grav Para Term Preterm Abortions TAB SAB Ect Mult Living                 Review of Systems  Constitutional: Positive for chills. Negative for fever.  HENT: Negative for dental problem.   Gastrointestinal: Positive for nausea and vomiting.  All other systems reviewed and are negative.  Allergies  Sulfa antibiotics  Home Medications   Prior to Admission medications   Medication Sig Start Date End  Date Taking? Authorizing Provider  amoxicillin (AMOXIL) 500 MG capsule Take 1 capsule (500 mg total) by mouth 3 (three) times daily. 12/04/13   Nicole Pisciotta, PA-C  ibuprofen (ADVIL,MOTRIN) 200 MG tablet Take 600 mg by mouth every 6 (six) hours as needed for moderate pain.    Historical Provider, MD  ondansetron (ZOFRAN) 4 MG tablet Take 1 tablet (4 mg total) by mouth every 6 (six) hours. 12/04/13   Antony MaduraKelly Joliana Claflin, PA-C  oxyCODONE-acetaminophen (PERCOCET/ROXICET) 5-325 MG per tablet 1 to 2 tabs PO q6hrs  PRN for pain 12/04/13   Wynetta EmeryNicole Pisciotta, PA-C   Triage Vitals: BP 135/86  Pulse 77  Temp(Src) 98 F (36.7 C) (Oral)  Resp 18  SpO2 99%  LMP 11/07/2013  Physical Exam  Nursing note and vitals reviewed. Constitutional: She is oriented to person, place, and time. She appears well-developed and well-nourished. No distress.  Nontoxic/nonseptic appearing  HENT:  Head: Normocephalic and atraumatic.  Eyes: Conjunctivae and EOM are normal. No scleral icterus.  Neck: Normal range of motion.  Cardiovascular: Normal rate, regular rhythm and intact distal pulses.   Pulmonary/Chest: Effort normal. No respiratory distress.  Chest expansion symmetric  Abdominal: She exhibits no distension. There is no tenderness. There is no guarding.  Soft, nontender. No peritoneal signs.  Musculoskeletal: Normal range of motion.  Neurological: She is alert and oriented to person, place, and time. She exhibits normal muscle tone. Coordination normal.  Skin: Skin is warm and dry. No rash noted. She is not diaphoretic. No erythema. No pallor.  Psychiatric: She has a normal mood and affect. Her behavior is normal.    ED Course  Procedures (including critical care time) DIAGNOSTIC STUDIES: Oxygen Saturation is 99% on RA, normal by my interpretation.    COORDINATION OF CARE: 11:54 PM- Patient informed of current plan for treatment and evaluation and agrees with plan at this time.   MDM   Final diagnoses:   Nausea  Medication reaction, initial encounter    33 year old female presents to the emergency department for further evaluation of nausea. Nausea began after taking Percocet and amoxicillin for management of dental pain. Patient endorses similar reaction to pain medicines in the past. She has been able to tolerate saltines and ginger ale without further emesis this evening. Patient has no abdominal tenderness. She denies abdominal pain. No peritoneal signs or guarding. No indication for further emergent workup. Patient treated in ED with Zofran. She will be discharged with a prescription for same. Return precautions discussed and provided. Patient agreeable to plan with no unaddressed concerns.  I personally performed the services described in this documentation, which was scribed in my presence. The recorded information has been reviewed and is accurate.   Filed Vitals:   12/04/13 2329  BP: 135/86  Pulse: 77  Temp: 98 F (36.7 C)  TempSrc: Oral  Resp: 18  SpO2: 99%      Antony MaduraKelly Daxton Nydam, PA-C 12/05/13 0014

## 2013-12-04 NOTE — ED Notes (Signed)
Pt states that she was seen her this am for dental pain and prescribed Amoxicillin and Percocet; pt states that her dental pain is better but she has vomited a few times and feels nauseous a couple of hours after taking the medications; pt states "I just need something for nausea"

## 2013-12-05 NOTE — ED Provider Notes (Signed)
Medical screening examination/treatment/procedure(s) were performed by non-physician practitioner and as supervising physician I was immediately available for consultation/collaboration.   EKG Interpretation None       Behr Cislo K Mendi Constable-Rasch, MD 12/05/13 0020

## 2014-02-07 ENCOUNTER — Encounter (HOSPITAL_COMMUNITY): Payer: Self-pay

## 2014-02-07 ENCOUNTER — Emergency Department (HOSPITAL_COMMUNITY)
Admission: EM | Admit: 2014-02-07 | Discharge: 2014-02-07 | Disposition: A | Discharge status: Home / Self Care | Payer: Self-pay | Attending: Emergency Medicine | Admitting: Emergency Medicine

## 2014-02-07 DIAGNOSIS — Z792 Long term (current) use of antibiotics: Secondary | ICD-10-CM | POA: Insufficient documentation

## 2014-02-07 DIAGNOSIS — K047 Periapical abscess without sinus: Secondary | ICD-10-CM | POA: Insufficient documentation

## 2014-02-07 DIAGNOSIS — K029 Dental caries, unspecified: Secondary | ICD-10-CM | POA: Insufficient documentation

## 2014-02-07 DIAGNOSIS — Z87891 Personal history of nicotine dependence: Secondary | ICD-10-CM | POA: Insufficient documentation

## 2014-02-07 MED ORDER — PENICILLIN V POTASSIUM 500 MG PO TABS: 500.0000 mg | ORAL_TABLET | Freq: Four times a day (QID) | ORAL | Status: DC

## 2014-02-07 MED ORDER — TRAMADOL HCL 50 MG PO TABS: 50.0000 mg | ORAL_TABLET | Freq: Four times a day (QID) | ORAL | Status: DC | PRN

## 2014-02-07 MED ORDER — TRAMADOL HCL 50 MG PO TABS
50.0000 mg | ORAL_TABLET | Freq: Once | ORAL | Status: AC
  Administered 2014-02-07: 50 mg via ORAL
  Filled 2014-02-07: qty 1

## 2014-02-07 MED ORDER — PENICILLIN V POTASSIUM 500 MG PO TABS
500.0000 mg | ORAL_TABLET | Freq: Once | ORAL | Status: AC
  Administered 2014-02-07: 500 mg via ORAL
  Filled 2014-02-07: qty 1

## 2014-02-07 NOTE — ED Provider Notes (Signed)
CSN: [id number]     Arrival date & time 02/07/14  0401 History   First MD Initiated Contact with Patient 02/07/14 0405     Chief Complaint  Patient presents with  . Dental Pain     (Consider location/radiation/quality/duration/timing/severity/associated sxs/prior Treatment) Patient is a 33 y.o. female presenting with tooth pain. The history is provided by the patient.  Dental Pain Location:  Lower Lower teeth location:  32/RL 3rd molar Quality:  Pulsating Severity:  Moderate Onset quality:  Gradual Timing:  Constant Progression:  Worsening Chronicity:  New Context: abscess, dental caries and enamel fracture   Relieved by:  Nothing Worsened by:  Hot food/drink, cold food/drink and touching Ineffective treatments:  NSAIDs Associated symptoms: no facial pain, no facial swelling, no fever and no headaches     History reviewed. No pertinent past medical history. Past Surgical History  Procedure Laterality Date  . Facial nerve decompression     Family History  Problem Relation Age of Onset  . Diabetes Father    History  Substance Use Topics  . Smoking status: Former Smoker    Quit date: 11/27/2013  . Smokeless tobacco: Current User     Comment: e-cigarettes  . Alcohol Use: No   OB History    No data available     Review of Systems  Constitutional: Negative for fever.  HENT: Positive for dental problem. Negative for facial swelling.   Respiratory: Negative for cough and shortness of breath.   Cardiovascular: Negative for chest pain.  Musculoskeletal: Negative for myalgias and arthralgias.  Skin: Negative for rash and wound.  Neurological: Negative for dizziness and headaches.  All other systems reviewed and are negative.     Allergies  Sulfa antibiotics  Home Medications   Prior to Admission medications   Medication Sig Start Date End Date Taking? Authorizing Provider  naproxen sodium (ANAPROX) 220 MG tablet Take 440 mg by mouth 2 (two) times daily as  needed (pain).   Yes Historical Provider, MD  amoxicillin (AMOXIL) 500 MG capsule Take 1 capsule (500 mg total) by mouth 3 (three) times daily. Patient not taking: Reported on 02/07/2014 12/04/13   Joni ReiningNicole Pisciotta, PA-C  ondansetron (ZOFRAN) 4 MG tablet Take 1 tablet (4 mg total) by mouth every 6 (six) hours. 12/04/13   Antony MaduraKelly Humes, PA-C  oxyCODONE-acetaminophen (PERCOCET/ROXICET) 5-325 MG per tablet 1 to 2 tabs PO q6hrs  PRN for pain Patient not taking: Reported on 02/07/2014 12/04/13   Joni ReiningNicole Pisciotta, PA-C  penicillin v potassium (VEETID) 500 MG tablet Take 1 tablet (500 mg total) by mouth 4 (four) times daily. 02/07/14   Arman FilterGail K Mcarthur Ivins, NP  traMADol (ULTRAM) 50 MG tablet Take 1 tablet (50 mg total) by mouth every 6 (six) hours as needed. 02/07/14   Arman FilterGail K Farren Nelles, NP   BP 142/94 mmHg  Pulse 77  Temp(Src) 98 F (36.7 C) (Oral)  Resp 18  SpO2 95%  LMP 01/31/2014 Physical Exam  Constitutional: She is oriented to person, place, and time. She appears well-developed and well-nourished.  HENT:  Head: Normocephalic.  Right Ear: External ear normal.  Left Ear: External ear normal.  Mouth/Throat:    Eyes: Pupils are equal, round, and reactive to light.  Neck: Normal range of motion.  Pulmonary/Chest: Effort normal.  Musculoskeletal: Normal range of motion.  Lymphadenopathy:    She has no cervical adenopathy.  Neurological: She is alert and oriented to person, place, and time.  Skin: Skin is warm.  Psychiatric: She has a normal  mood and affect.  Nursing note and vitals reviewed.   ED Course  Procedures (including critical care time) Labs Review Labs Reviewed - No data to display  Imaging Review No results found.   EKG Interpretation None      MDM   Final diagnoses:  Dental abscess  Dental caries         Arman FilterGail K Zynasia Burklow, NP 02/07/14 0444  Lyanne CoKevin M Campos, MD 02/07/14 [phone]

## 2014-02-07 NOTE — ED Notes (Signed)
Pt presents with c/o dental pain on the right side of her mouth that started yesterday.

## 2014-02-07 NOTE — Discharge Instructions (Signed)
Dental Abscess °A dental abscess is a collection of infected fluid (pus) from a bacterial infection in the inner part of the tooth (pulp). It usually occurs at the end of the tooth's root.  °CAUSES  °· Severe tooth decay. °· Trauma to the tooth that allows bacteria to enter into the pulp, such as a broken or chipped tooth. °SYMPTOMS  °· Severe pain in and around the infected tooth. °· Swelling and redness around the abscessed tooth or in the mouth or face. °· Tenderness. °· Pus drainage. °· Bad breath. °· Bitter taste in the mouth. °· Difficulty swallowing. °· Difficulty opening the mouth. °· Nausea. °· Vomiting. °· Chills. °· Swollen neck glands. °DIAGNOSIS  °· A medical and dental history will be taken. °· An examination will be performed by tapping on the abscessed tooth. °· X-rays may be taken of the tooth to identify the abscess. °TREATMENT °The goal of treatment is to eliminate the infection. You may be prescribed antibiotic medicine to stop the infection from spreading. A root canal may be performed to save the tooth. If the tooth cannot be saved, it may be pulled (extracted) and the abscess may be drained.  °HOME CARE INSTRUCTIONS °· Only take over-the-counter or prescription medicines for pain, fever, or discomfort as directed by your caregiver. °· Rinse your mouth (gargle) often with salt water (¼ tsp salt in 8 oz [250 ml] of warm water) to relieve pain or swelling. °· Do not drive after taking pain medicine (narcotics). °· Do not apply heat to the outside of your face. °· Return to your dentist for further treatment as directed. °SEEK MEDICAL CARE IF: °· Your pain is not helped by medicine. °· Your pain is getting worse instead of better. °SEEK IMMEDIATE MEDICAL CARE IF: °· You have a fever or persistent symptoms for more than 2-3 days. °· You have a fever and your symptoms suddenly get worse. °· You have chills or a very bad headache. °· You have problems breathing or swallowing. °· You have trouble  opening your mouth. °· You have swelling in the neck or around the eye. °Document Released: 02/02/2005 Document Revised: 10/28/2011 Document Reviewed: 05/13/2010 °ExitCare® Patient Information ©2015 [employer]. This information is not intended to replace advice given to you by your health care provider. Make sure you discuss any questions you have with your health care provider. ° °Dental Care and Dentist Visits °Dental care supports good overall health. Regular dental visits can also help you avoid dental pain, bleeding, infection, and other more serious health problems in the future. It is important to keep the mouth healthy because diseases in the teeth, gums, and other oral tissues can spread to other areas of the body. Some problems, such as diabetes, heart disease, and pre-term labor have been associated with poor oral health.  °See your dentist every 6 months. If you experience emergency problems such as a toothache or broken tooth, go to the dentist right away. If you see your dentist regularly, you may catch problems early. It is easier to be treated for problems in the early stages.  °WHAT TO EXPECT AT A DENTIST VISIT  °Your dentist will look for many common oral health problems and recommend proper treatment. At your regular dental visit, you can expect: °· Gentle cleaning of the teeth and gums. This includes scraping and polishing. This helps to remove the sticky substance around the teeth and gums (plaque). Plaque forms in the mouth shortly after eating. Over time, plaque hardens   on the teeth as tartar. If tartar is not removed regularly, it can cause problems. Cleaning also helps remove stains. °· Periodic X-rays. These pictures of the teeth and supporting bone will help your dentist assess the health of your teeth. °· Periodic fluoride treatments. Fluoride is a natural mineral shown to help strengthen teeth. Fluoride treatment involves applying a fluoride gel or varnish to the teeth. It is most  commonly done in children. °· Examination of the mouth, tongue, jaws, teeth, and gums to look for any oral health problems, such as: °¨ Cavities (dental caries). This is decay on the tooth caused by plaque, sugar, and acid in the mouth. It is best to catch a cavity when it is small. °¨ Inflammation of the gums caused by plaque buildup (gingivitis). °¨ Problems with the mouth or malformed or misaligned teeth. °¨ Oral cancer or other diseases of the soft tissues or jaws.  °KEEP YOUR TEETH AND GUMS HEALTHY °For healthy teeth and gums, follow these general guidelines as well as your dentist's specific advice: °· Have your teeth professionally cleaned at the dentist every 6 months. °· Brush twice daily with a fluoride toothpaste. °· Floss your teeth daily.  °· Ask your dentist if you need fluoride supplements, treatments, or fluoride toothpaste. °· Eat a healthy diet. Reduce foods and drinks with added sugar. °· Avoid smoking. °TREATMENT FOR ORAL HEALTH PROBLEMS °If you have oral health problems, treatment varies depending on the conditions present in your teeth and gums. °· Your caregiver will most likely recommend good oral hygiene at each visit. °· For cavities, gingivitis, or other oral health disease, your caregiver will perform a procedure to treat the problem. This is typically done at a separate appointment. Sometimes your caregiver will refer you to another dental specialist for specific tooth problems or for surgery. °SEEK IMMEDIATE DENTAL CARE IF: °· You have pain, bleeding, or soreness in the gum, tooth, jaw, or mouth area. °· A permanent tooth becomes loose or separated from the gum socket. °· You experience a blow or injury to the mouth or jaw area. °Document Released: 10/15/2010 Document Revised: 04/27/2011 Document Reviewed: 10/15/2010 °ExitCare® Patient Information ©2015 [employer]. This information is not intended to replace advice given to you by your health care provider. Make sure you discuss any  questions you have with your health care provider. ° °

## 2014-02-18 ENCOUNTER — Encounter (HOSPITAL_BASED_OUTPATIENT_CLINIC_OR_DEPARTMENT_OTHER): Payer: Self-pay | Admitting: *Deleted

## 2014-02-18 ENCOUNTER — Emergency Department (HOSPITAL_BASED_OUTPATIENT_CLINIC_OR_DEPARTMENT_OTHER): Payer: 59

## 2014-02-18 ENCOUNTER — Emergency Department (HOSPITAL_BASED_OUTPATIENT_CLINIC_OR_DEPARTMENT_OTHER)
Admission: EM | Admit: 2014-02-18 | Discharge: 2014-02-18 | Disposition: A | Discharge status: Home / Self Care | Payer: 59 | Attending: Emergency Medicine | Admitting: Emergency Medicine

## 2014-02-18 DIAGNOSIS — Z792 Long term (current) use of antibiotics: Secondary | ICD-10-CM | POA: Diagnosis not present

## 2014-02-18 DIAGNOSIS — N12 Tubulo-interstitial nephritis, not specified as acute or chronic: Secondary | ICD-10-CM | POA: Diagnosis not present

## 2014-02-18 DIAGNOSIS — R109 Unspecified abdominal pain: Secondary | ICD-10-CM

## 2014-02-18 DIAGNOSIS — Z87891 Personal history of nicotine dependence: Secondary | ICD-10-CM | POA: Insufficient documentation

## 2014-02-18 DIAGNOSIS — Z3202 Encounter for pregnancy test, result negative: Secondary | ICD-10-CM | POA: Diagnosis not present

## 2014-02-18 DIAGNOSIS — Z791 Long term (current) use of non-steroidal anti-inflammatories (NSAID): Secondary | ICD-10-CM | POA: Diagnosis not present

## 2014-02-18 DIAGNOSIS — Z87442 Personal history of urinary calculi: Secondary | ICD-10-CM | POA: Insufficient documentation

## 2014-02-18 LAB — CBC WITH DIFFERENTIAL/PLATELET
BASOS ABS: 0 10*3/uL (ref 0.0–0.1)
BASOS PCT: 0 % (ref 0–1)
EOS ABS: 0.2 10*3/uL (ref 0.0–0.7)
EOS PCT: 1 % (ref 0–5)
HEMATOCRIT: 38.8 % (ref 36.0–46.0)
Hemoglobin: 13.2 g/dL (ref 12.0–15.0)
Lymphocytes Relative: 7 % — ABNORMAL LOW (ref 12–46)
Lymphs Abs: 1.4 10*3/uL (ref 0.7–4.0)
MCH: 32.3 pg (ref 26.0–34.0)
MCHC: 34 g/dL (ref 30.0–36.0)
MCV: 94.9 fL (ref 78.0–100.0)
MONOS PCT: 7 % (ref 3–12)
Monocytes Absolute: 1.3 10*3/uL — ABNORMAL HIGH (ref 0.1–1.0)
NEUTROS ABS: 16.1 10*3/uL — AB (ref 1.7–7.7)
Neutrophils Relative %: 85 % — ABNORMAL HIGH (ref 43–77)
Platelets: 230 10*3/uL (ref 150–400)
RBC: 4.09 MIL/uL (ref 3.87–5.11)
RDW: 12 % (ref 11.5–15.5)
WBC: 19.1 10*3/uL — ABNORMAL HIGH (ref 4.0–10.5)

## 2014-02-18 LAB — URINALYSIS, ROUTINE W REFLEX MICROSCOPIC
Glucose, UA: NEGATIVE mg/dL
KETONES UR: 15 mg/dL — AB
Nitrite: POSITIVE — AB
Protein, ur: 100 mg/dL — AB
Specific Gravity, Urine: 1.021 (ref 1.005–1.030)
UROBILINOGEN UA: 1 mg/dL (ref 0.0–1.0)
pH: 5.5 (ref 5.0–8.0)

## 2014-02-18 LAB — COMPREHENSIVE METABOLIC PANEL
ALBUMIN: 4.5 g/dL (ref 3.5–5.2)
ALT: 13 U/L (ref 0–35)
ANION GAP: 7 (ref 5–15)
AST: 21 U/L (ref 0–37)
Alkaline Phosphatase: 43 U/L (ref 39–117)
BUN: 9 mg/dL (ref 6–23)
CALCIUM: 9 mg/dL (ref 8.4–10.5)
CHLORIDE: 106 meq/L (ref 96–112)
CO2: 26 mmol/L (ref 19–32)
CREATININE: 0.64 mg/dL (ref 0.50–1.10)
GFR calc Af Amer: >90 mL/min (ref >90)
GFR calc non Af Amer: >90 mL/min (ref >90)
Glucose, Bld: 112 mg/dL — ABNORMAL HIGH (ref 70–99)
Potassium: 3.3 mmol/L — ABNORMAL LOW (ref 3.5–5.1)
Sodium: 139 mmol/L (ref 135–145)
TOTAL PROTEIN: 7.5 g/dL (ref 6.0–8.3)
Total Bilirubin: 1.1 mg/dL (ref 0.3–1.2)

## 2014-02-18 LAB — PREGNANCY, URINE: PREG TEST UR: NEGATIVE

## 2014-02-18 LAB — URINE MICROSCOPIC-ADD ON

## 2014-02-18 LAB — LIPASE, BLOOD: LIPASE: 22 U/L (ref 11–59)

## 2014-02-18 MED ORDER — CIPROFLOXACIN HCL 500 MG PO TABS: 500.0000 mg | ORAL_TABLET | Freq: Two times a day (BID) | ORAL | Status: DC

## 2014-02-18 MED ORDER — ACETAMINOPHEN 500 MG PO TABS
1000.0000 mg | ORAL_TABLET | Freq: Once | ORAL | Status: AC
  Administered 2014-02-18: 1000 mg via ORAL
  Filled 2014-02-18: qty 2

## 2014-02-18 MED ORDER — CEFTRIAXONE SODIUM 1 G IJ SOLR
INTRAMUSCULAR | Status: AC
  Filled 2014-02-18: qty 10

## 2014-02-18 MED ORDER — POTASSIUM CHLORIDE CRYS ER 20 MEQ PO TBCR
40.0000 meq | EXTENDED_RELEASE_TABLET | Freq: Once | ORAL | Status: AC
  Administered 2014-02-18: 40 meq via ORAL
  Filled 2014-02-18: qty 2

## 2014-02-18 MED ORDER — HYDROCODONE-ACETAMINOPHEN 5-325 MG PO TABS: 1.0000 | ORAL_TABLET | Freq: Four times a day (QID) | ORAL | Status: DC | PRN

## 2014-02-18 MED ORDER — MORPHINE SULFATE 4 MG/ML IJ SOLN
4.0000 mg | Freq: Once | INTRAMUSCULAR | Status: AC
  Administered 2014-02-18: 4 mg via INTRAVENOUS
  Filled 2014-02-18: qty 1

## 2014-02-18 MED ORDER — ONDANSETRON HCL 4 MG/2ML IJ SOLN
4.0000 mg | Freq: Once | INTRAMUSCULAR | Status: AC
  Administered 2014-02-18: 4 mg via INTRAVENOUS
  Filled 2014-02-18: qty 2

## 2014-02-18 MED ORDER — DEXTROSE 5 % IV SOLN
1.0000 g | Freq: Once | INTRAVENOUS | Status: AC
  Administered 2014-02-18: 1 g via INTRAVENOUS

## 2014-02-18 MED ORDER — METOCLOPRAMIDE HCL 10 MG PO TABS: 10.0000 mg | ORAL_TABLET | Freq: Four times a day (QID) | ORAL | Status: DC

## 2014-02-18 MED ORDER — SODIUM CHLORIDE 0.9 % IV BOLUS (SEPSIS)
1000.0000 mL | Freq: Once | INTRAVENOUS | Status: AC
  Administered 2014-02-18: 1000 mL via INTRAVENOUS

## 2014-02-18 MED ORDER — KETOROLAC TROMETHAMINE 30 MG/ML IJ SOLN
30.0000 mg | Freq: Once | INTRAMUSCULAR | Status: AC
  Administered 2014-02-18: 30 mg via INTRAVENOUS
  Filled 2014-02-18: qty 1

## 2014-02-18 NOTE — Discharge Instructions (Signed)
Stay hydrated.   Take reglan as needed for nausea.   Take cipro for 10 days.   Take vicodin for severe pain.   See your doctor.   Return to ER if you have fever, severe pain, vomiting, dehydration.

## 2014-02-18 NOTE — ED Provider Notes (Addendum)
CSN: [id number]     Arrival date & time 02/18/14  0906 History   First MD Initiated Contact with Patient 02/18/14 0915     Chief Complaint  Patient presents with  . Flank Pain     (Consider location/radiation/quality/duration/timing/severity/associated sxs/prior Treatment) The history is provided by the patient.  PA TENNANT is a 34 y.o. female hx of kidney stone here with L flank pain. L flank pain for the last 2-3 days. Also has some dysuria so tried azo. Since yesterday, had vomiting and was unable to keep anything down. Denies fevers but has low grade temp here. States that it is sharp, nonradiating, similar to previous stone. She was seen a week ago for dental pain and had tooth removed. Was on tramadol.   History reviewed. No pertinent past medical history. Past Surgical History  Procedure Laterality Date  . Facial nerve decompression     Family History  Problem Relation Age of Onset  . Diabetes Father    History  Substance Use Topics  . Smoking status: Former Smoker    Quit date: 11/27/2013  . Smokeless tobacco: Current User     Comment: e-cigarettes  . Alcohol Use: No   OB History    No data available     Review of Systems  Genitourinary: Positive for flank pain and decreased urine volume.  All other systems reviewed and are negative.     Allergies  Sulfa antibiotics  Home Medications   Prior to Admission medications   Medication Sig Start Date End Date Taking? Authorizing Provider  amoxicillin (AMOXIL) 500 MG capsule Take 1 capsule (500 mg total) by mouth 3 (three) times daily. Patient not taking: Reported on 02/07/2014 12/04/13   Joni Reining Pisciotta, PA-C  naproxen sodium (ANAPROX) 220 MG tablet Take 440 mg by mouth 2 (two) times daily as needed (pain).    Historical Provider, MD  ondansetron (ZOFRAN) 4 MG tablet Take 1 tablet (4 mg total) by mouth every 6 (six) hours. 12/04/13   Antony Madura, PA-C  oxyCODONE-acetaminophen (PERCOCET/ROXICET) 5-325 MG  per tablet 1 to 2 tabs PO q6hrs  PRN for pain Patient not taking: Reported on 02/07/2014 12/04/13   Joni Reining Pisciotta, PA-C  penicillin v potassium (VEETID) 500 MG tablet Take 1 tablet (500 mg total) by mouth 4 (four) times daily. 02/07/14   Arman Filter, NP  traMADol (ULTRAM) 50 MG tablet Take 1 tablet (50 mg total) by mouth every 6 (six) hours as needed. 02/07/14   Arman Filter, NP   BP 132/80 mmHg  Pulse 107  Temp(Src) 99.1 F (37.3 C) (Oral)  Resp 18  Ht  (1.651 m)  Wt 155 lb (70.308 kg)  BMI 25.79 kg/m2  SpO2 98%  LMP 01/31/2014 Physical Exam  Constitutional: She is oriented to person, place, and time.  Uncomfortable   HENT:  Head: Normocephalic.  MM slightly dry   Eyes: Conjunctivae are normal. Pupils are equal, round, and reactive to light.  Neck: Normal range of motion. Neck supple.  Cardiovascular: Normal rate, regular rhythm and normal heart sounds.   Pulmonary/Chest: Effort normal and breath sounds normal. No respiratory distress. She has no wheezes. She has no rales.  Abdominal: Soft. Bowel sounds are normal. She exhibits no distension. There is no tenderness. There is no rebound.  + L CVAT   Musculoskeletal: Normal range of motion. She exhibits no edema or tenderness.  Neurological: She is alert and oriented to person, place, and time. No cranial nerve deficit. Coordination  normal.  Skin: Skin is warm and dry.  Psychiatric: She has a normal mood and affect. Her behavior is normal. Judgment and thought content normal.  Nursing note and vitals reviewed.   ED Course  Procedures (including critical care time) Labs Review Labs Reviewed  URINALYSIS, ROUTINE W REFLEX MICROSCOPIC - Abnormal; Notable for the following:    Color, Urine ORANGE (*)    APPearance TURBID (*)    Hgb urine dipstick MODERATE (*)    Bilirubin Urine SMALL (*)    Ketones, ur 15 (*)    Protein, ur 100 (*)    Nitrite POSITIVE (*)    Leukocytes, UA LARGE (*)    All other components within  normal limits  CBC WITH DIFFERENTIAL - Abnormal; Notable for the following:    WBC 19.1 (*)    Neutrophils Relative % 85 (*)    Neutro Abs 16.1 (*)    Lymphocytes Relative 7 (*)    Monocytes Absolute 1.3 (*)    All other components within normal limits  COMPREHENSIVE METABOLIC PANEL - Abnormal; Notable for the following:    Potassium 3.3 (*)    Glucose, Bld 112 (*)    All other components within normal limits  URINE MICROSCOPIC-ADD ON - Abnormal; Notable for the following:    Squamous Epithelial / LPF MANY (*)    Bacteria, UA MANY (*)    All other components within normal limits  URINE CULTURE  PREGNANCY, URINE  LIPASE, BLOOD    Imaging Review Ct Renal Stone Study  02/18/2014   CLINICAL DATA:  CHEST PAIN - UPPER LEFT - SINCE LAST NIGHT - NO KNOWN HISTORY OF HEART DISEASE  EXAM: CT ABDOMEN AND PELVIS WITHOUT CONTRAST  TECHNIQUE: Multidetector CT imaging of the abdomen and pelvis was performed following the standard protocol without IV contrast.  COMPARISON:  09/03/2005  FINDINGS: Visualized lung bases clear, some images obscured by continued patient breathing during the acquisition. Unremarkable liver, nondilated gallbladder, spleen, adrenal glands, pancreas, right kidney, abdominal aorta. No nephrolithiasis or hydronephrosis. Mild inflammatory/edematous changes around the left kidney, renal collecting system and proximal ureter which tapers to normal caliber. Unenhanced CT was performed per clinician order. Lack of IV contrast limits sensitivity and specificity, especially for evaluation of abdominal/pelvic solid viscera. No evidence of ureteral calculus.  Urinary bladder is incompletely distended.  Left pelvic phleboliths.  Stomach, small bowel, and colon are nondilated. Appendix not identified. Uterus unremarkable. Physiologic appearance of adnexal regions. Trace free pelvic fluid, probably physiologic. No free air. No adenopathy localized. Lumbar spine unremarkable.  IMPRESSION: 1. Negative  for nephrolithiasis, ureteral calculus, or hydronephrosis. 2. Inflammatory/edematous changes around the left kidney, renal collecting system and proximal ureter ; correlate with any clinical or laboratory evidence of UTI/pyelonephritis.   Electronically Signed   By: Oley Balm M.D.   On: 02/18/2014 10:05     EKG Interpretation None      MDM   Final diagnoses:  Flank pain, acute    Shannon Landry is a 34 y.o. female here with vomiting, l flank pain. Consider pyelo vs stone. Will get labs, CT ab/pel, UA.   11:07 AM CT showed no stone, but some pyelo. WBC 19. UA + UTI. I think she has pyelo. Pain controlled, tolerated PO. Well appearing, doesn't appear septic from it. K slightly low, supplemented. Will dc home with cipro, vicodin, reglan.      Richardean Canal, MD 02/18/14 1108  Richardean Canal, MD 02/18/14 [phone]

## 2014-02-18 NOTE — ED Notes (Signed)
Patient having left flank, side and lower abd pain. Patient has N/V as well as only being able to urinate a small amount at a time.

## 2014-02-21 LAB — URINE CULTURE: Colony Count: >=100000

## 2014-02-22 ENCOUNTER — Telehealth (HOSPITAL_BASED_OUTPATIENT_CLINIC_OR_DEPARTMENT_OTHER): Payer: Self-pay | Admitting: Emergency Medicine

## 2014-02-22 NOTE — Telephone Encounter (Signed)
Post ED Visit - Positive Culture Follow-up  Culture report reviewed by antimicrobial stewardship pharmacist: []  Wes Dulaney, Pharm.D., BCPS []  Celedonio MiyamotoJeremy Frens, Pharm.D., BCPS [x]  Georgina PillionElizabeth Martin, [address] BCPS []  OvillaMinh Pham, [address]Ph[address] BCPS, AAHIVP []  Estella HuskMichelle Turner, Pharm.D., BCPS, AAHIVP []  Elder CyphersLorie Poole, [address]Ph[address] BCPS  Positive urine culture E. Coli Treated with ciprofloxacin, organism sensitive to the same and no further patient follow-up is required at this time.  Berle MullMiller, Caniyah Murley 02/22/2014, 11:23 AM

## 2015-01-01 ENCOUNTER — Emergency Department (HOSPITAL_BASED_OUTPATIENT_CLINIC_OR_DEPARTMENT_OTHER)
Admission: EM | Admit: 2015-01-01 | Discharge: 2015-01-01 | Disposition: A | Discharge status: Home / Self Care | Payer: 59 | Attending: Emergency Medicine | Admitting: Emergency Medicine

## 2015-01-01 ENCOUNTER — Encounter (HOSPITAL_BASED_OUTPATIENT_CLINIC_OR_DEPARTMENT_OTHER): Payer: Self-pay | Admitting: Emergency Medicine

## 2015-01-01 DIAGNOSIS — Z3202 Encounter for pregnancy test, result negative: Secondary | ICD-10-CM | POA: Insufficient documentation

## 2015-01-01 DIAGNOSIS — Z792 Long term (current) use of antibiotics: Secondary | ICD-10-CM | POA: Diagnosis not present

## 2015-01-01 DIAGNOSIS — K5900 Constipation, unspecified: Secondary | ICD-10-CM | POA: Diagnosis present

## 2015-01-01 DIAGNOSIS — Z87891 Personal history of nicotine dependence: Secondary | ICD-10-CM | POA: Diagnosis not present

## 2015-01-01 DIAGNOSIS — N390 Urinary tract infection, site not specified: Secondary | ICD-10-CM

## 2015-01-01 DIAGNOSIS — N72 Inflammatory disease of cervix uteri: Secondary | ICD-10-CM | POA: Insufficient documentation

## 2015-01-01 LAB — CBC WITH DIFFERENTIAL/PLATELET
BASOS ABS: 0 10*3/uL (ref 0.0–0.1)
Basophils Relative: 0 %
EOS ABS: 0.2 10*3/uL (ref 0.0–0.7)
EOS PCT: 2 %
HCT: 38.2 % (ref 36.0–46.0)
Hemoglobin: 12.6 g/dL (ref 12.0–15.0)
LYMPHS ABS: 3.3 10*3/uL (ref 0.7–4.0)
Lymphocytes Relative: 27 %
MCH: 31.3 pg (ref 26.0–34.0)
MCHC: 33 g/dL (ref 30.0–36.0)
MCV: 94.8 fL (ref 78.0–100.0)
MONO ABS: 0.9 10*3/uL (ref 0.1–1.0)
Monocytes Relative: 7 %
Neutro Abs: 8 10*3/uL — ABNORMAL HIGH (ref 1.7–7.7)
Neutrophils Relative %: 64 %
PLATELETS: 266 10*3/uL (ref 150–400)
RBC: 4.03 MIL/uL (ref 3.87–5.11)
RDW: 12.1 % (ref 11.5–15.5)
WBC: 12.4 10*3/uL — AB (ref 4.0–10.5)

## 2015-01-01 LAB — WET PREP, GENITAL
Sperm: NONE SEEN
Trich, Wet Prep: NONE SEEN
Yeast Wet Prep HPF POC: NONE SEEN

## 2015-01-01 LAB — URINALYSIS, ROUTINE W REFLEX MICROSCOPIC
GLUCOSE, UA: NEGATIVE mg/dL
HGB URINE DIPSTICK: NEGATIVE
Ketones, ur: 15 mg/dL — AB
Nitrite: NEGATIVE
PH: 5.5 (ref 5.0–8.0)
Protein, ur: 30 mg/dL — AB
Specific Gravity, Urine: 1.038 — ABNORMAL HIGH (ref 1.005–1.030)

## 2015-01-01 LAB — URINE MICROSCOPIC-ADD ON

## 2015-01-01 LAB — COMPREHENSIVE METABOLIC PANEL
ALK PHOS: 38 U/L (ref 38–126)
ALT: 10 U/L — AB (ref 14–54)
AST: 12 U/L — AB (ref 15–41)
Albumin: 3.9 g/dL (ref 3.5–5.0)
Anion gap: 6 (ref 5–15)
BILIRUBIN TOTAL: 0.4 mg/dL (ref 0.3–1.2)
BUN: 9 mg/dL (ref 6–20)
CALCIUM: 8.7 mg/dL — AB (ref 8.9–10.3)
CO2: 29 mmol/L (ref 22–32)
CREATININE: 0.56 mg/dL (ref 0.44–1.00)
Chloride: 107 mmol/L (ref 101–111)
GFR calc Af Amer: >60 mL/min (ref >60)
Glucose, Bld: 102 mg/dL — ABNORMAL HIGH (ref 65–99)
Potassium: 3.6 mmol/L (ref 3.5–5.1)
Sodium: 142 mmol/L (ref 135–145)
TOTAL PROTEIN: 6.4 g/dL — AB (ref 6.5–8.1)

## 2015-01-01 LAB — LIPASE, BLOOD: LIPASE: 26 U/L (ref 11–51)

## 2015-01-01 LAB — PREGNANCY, URINE: PREG TEST UR: NEGATIVE

## 2015-01-01 MED ORDER — CEFTRIAXONE SODIUM 250 MG IJ SOLR
250.0000 mg | Freq: Once | INTRAMUSCULAR | Status: AC
  Administered 2015-01-01: 250 mg via INTRAMUSCULAR
  Filled 2015-01-01: qty 250

## 2015-01-01 MED ORDER — AZITHROMYCIN 250 MG PO TABS
1000.0000 mg | ORAL_TABLET | Freq: Once | ORAL | Status: AC
  Administered 2015-01-01: 1000 mg via ORAL
  Filled 2015-01-01: qty 4

## 2015-01-01 MED ORDER — CEPHALEXIN 500 MG PO CAPS: 500.0000 mg | ORAL_CAPSULE | Freq: Two times a day (BID) | ORAL | Status: DC

## 2015-01-01 MED ORDER — METRONIDAZOLE 500 MG PO TABS: 500.0000 mg | ORAL_TABLET | Freq: Two times a day (BID) | ORAL | Status: DC

## 2015-01-01 NOTE — ED Notes (Signed)
Pa  at bedside. 

## 2015-01-01 NOTE — ED Provider Notes (Signed)
CSN: [id number]     Arrival date & time 01/01/15  1444 History   First MD Initiated Contact with Patient 01/01/15 1621     Chief Complaint  Patient presents with  . Constipation     (Consider location/radiation/quality/duration/timing/severity/associated sxs/prior Treatment) HPI   MARGEE Landry is a 34 y.o. female with no significant PMH who presents with dysuria and abnormal BMs that began on Saturday.  Patient reports normal frequency of BMs, but that her stools appear to have mucous on them.  She also complains of dysuria since Saturday along with a yellowish/white vaginal discharge.  Nothing makes it better or worse. She tried flagyl because she reports she is prone to BV, but that did not alleviate her symptoms.  Denies recent change in diet, travel, or abx use.  Assoc symptoms include intermittent nausea, dark urine, and tenesmus.  Denies abdominal pain, bloody stools, vomiting, increased urinary frequency, vomiting, hematuria, vaginal itching or burning, or fevers.   History reviewed. No pertinent past medical history. Past Surgical History  Procedure Laterality Date  . Facial nerve decompression     Family History  Problem Relation Age of Onset  . Diabetes Father    Social History  Substance Use Topics  . Smoking status: Former Smoker    Quit date: 11/27/2013  . Smokeless tobacco: Current User     Comment: e-cigarettes  . Alcohol Use: No   OB History    No data available     Review of Systems All other systems negative unless otherwise stated in HPI    Allergies  Sulfa antibiotics  Home Medications   Prior to Admission medications   Medication Sig Start Date End Date Taking? Authorizing Provider  amoxicillin (AMOXIL) 500 MG capsule Take 1 capsule (500 mg total) by mouth 3 (three) times daily. Patient not taking: Reported on 02/07/2014 12/04/13   Joni Reining Pisciotta, PA-C  cephALEXin (KEFLEX) 500 MG capsule Take 1 capsule (500 mg total) by mouth 2 (two)  times daily. 01/01/15   Cheri Fowler, PA-C  ciprofloxacin (CIPRO) 500 MG tablet Take 1 tablet (500 mg total) by mouth 2 (two) times daily. 02/18/14   Richardean Canal, MD  HYDROcodone-acetaminophen (NORCO/VICODIN) 5-325 MG per tablet Take 1 tablet by mouth every 6 (six) hours as needed for moderate pain or severe pain. 02/18/14   Richardean Canal, MD  metoCLOPramide (REGLAN) 10 MG tablet Take 1 tablet (10 mg total) by mouth every 6 (six) hours. 02/18/14   Richardean Canal, MD  metroNIDAZOLE (FLAGYL) 500 MG tablet Take 1 tablet (500 mg total) by mouth 2 (two) times daily. 01/01/15   Cheri Fowler, PA-C  naproxen sodium (ANAPROX) 220 MG tablet Take 440 mg by mouth 2 (two) times daily as needed (pain).    Historical Provider, MD  ondansetron (ZOFRAN) 4 MG tablet Take 1 tablet (4 mg total) by mouth every 6 (six) hours. 12/04/13   Antony Madura, PA-C  oxyCODONE-acetaminophen (PERCOCET/ROXICET) 5-325 MG per tablet 1 to 2 tabs PO q6hrs  PRN for pain Patient not taking: Reported on 02/07/2014 12/04/13   Joni Reining Pisciotta, PA-C  penicillin v potassium (VEETID) 500 MG tablet Take 1 tablet (500 mg total) by mouth 4 (four) times daily. 02/07/14   Earley Favor, NP  traMADol (ULTRAM) 50 MG tablet Take 1 tablet (50 mg total) by mouth every 6 (six) hours as needed. 02/07/14   Earley Favor, NP   BP 118/85 mmHg  Pulse 66  Temp(Src) 98.5 F (36.9 C) (Oral)  Resp  18  SpO2 100%  LMP 12/19/2014 Physical Exam  Constitutional: She is oriented to person, place, and time. She appears well-developed and well-nourished.  HENT:  Head: Normocephalic and atraumatic.  Mouth/Throat: Oropharynx is clear and moist.  Eyes: Conjunctivae are normal. Pupils are equal, round, and reactive to light.  Neck: Normal range of motion. Neck supple.  Cardiovascular: Normal rate, regular rhythm and normal heart sounds.   No murmur heard. Pulmonary/Chest: Effort normal and breath sounds normal. No accessory muscle usage or stridor. No respiratory distress. She has no  wheezes. She has no rhonchi. She has no rales.  Abdominal: Soft. Bowel sounds are normal. She exhibits no distension. There is no tenderness.  Genitourinary: Rectum normal and uterus normal. Rectal exam shows no external hemorrhoid, no fissure, no mass, no tenderness and anal tone normal. There is no tenderness on the right labia. There is no tenderness on the left labia. Cervix exhibits discharge (moderate white). Cervix exhibits no motion tenderness. Right adnexum displays no tenderness. Left adnexum displays no tenderness. No bleeding in the vagina. Vaginal discharge found.  Musculoskeletal: Normal range of motion.  Lymphadenopathy:    She has no cervical adenopathy.  Neurological: She is alert and oriented to person, place, and time.  Speech clear without dysarthria.  Skin: Skin is warm and dry.  Psychiatric: She has a normal mood and affect. Her behavior is normal.    ED Course  Procedures (including critical care time) Labs Review Labs Reviewed  WET PREP, GENITAL - Abnormal; Notable for the following:    Clue Cells Wet Prep HPF POC PRESENT (*)    WBC, Wet Prep HPF POC MANY (*)    All other components within normal limits  URINALYSIS, ROUTINE W REFLEX MICROSCOPIC (NOT [facility]) - Abnormal; Notable for the following:    Color, Urine AMBER (*)    APPearance TURBID (*)    Specific Gravity, Urine 1.038 (*)    Bilirubin Urine SMALL (*)    Ketones, ur 15 (*)    Protein, ur 30 (*)    Leukocytes, UA MODERATE (*)    All other components within normal limits  URINE MICROSCOPIC-ADD ON - Abnormal; Notable for the following:    Squamous Epithelial / LPF 6-30 (*)    Bacteria, UA FEW (*)    All other components within normal limits  COMPREHENSIVE METABOLIC PANEL - Abnormal; Notable for the following:    Glucose, Bld 102 (*)    Calcium 8.7 (*)    Total Protein 6.4 (*)    AST 12 (*)    ALT 10 (*)    All other components within normal limits  CBC WITH DIFFERENTIAL/PLATELET - Abnormal;  Notable for the following:    WBC 12.4 (*)    Neutro Abs 8.0 (*)    All other components within normal limits  PREGNANCY, URINE  LIPASE, BLOOD  HIV ANTIBODY (ROUTINE TESTING)  GC/CHLAMYDIA PROBE AMP ([place]) NOT AT [facility]    Imaging Review No results found. I have personally reviewed and evaluated these images and lab results as part of my medical decision-making.   EKG Interpretation None      MDM   Final diagnoses:  UTI (lower urinary tract infection)  Cervicitis    Patient presents with vaginal discharge, dysuria, and abnormal BMs.  VSS, NAD.  On exam, heart RRR, lungs CTAB, abdomen soft and benign. No CMT or adnexal tenderness.  Moderate white cervical discharge.  DRE unremarkable, no mass, lesions, or fissures noted.  Will  obtain labs.  Doubt TOA.  Doubt PID.  Doubt rectal mass.  Doubt fecal impaction.  Concern for STD.  Will give rocephin and azithromycin here.   CMP, lipase unremarkable CBC shows mild leukocytosis, 12.4.  Hbg 12.6 UA shows no signs of infection.  Suspect WBCs secondary to cervical discharge and not UTI.   Wet prep shows evidence of BV and many WBCs.  Suspect cervicitis.  Evaluation does not show pathology requring ongoing emergent intervention or admission. Pt is hemodynamically stable and mentating appropriately. Discussed findings/results and plan with patient/guardian, who agrees with plan. All questions answered. Return precautions discussed and outpatient follow up given.       Cheri Fowler, PA-C 01/01/15 2057  Alvira Monday, MD 01/02/15 2117

## 2015-01-01 NOTE — ED Notes (Signed)
D/c home- rx x given for keflex and flagyl

## 2015-01-01 NOTE — ED Notes (Signed)
Patient states that she is having mucous in her stool, and feeling like she has to have a BM all the time. Reports that her LBM was today. Patient also reports that shshe is having burning with urination and pressure and pain

## 2015-01-01 NOTE — ED Notes (Signed)
PA at bedside.

## 2015-01-01 NOTE — Discharge Instructions (Signed)
Cervicitis Cervicitis is a soreness and swelling (inflammation) of the cervix. Your cervix is located at the bottom of your uterus. It opens up to the vagina. CAUSES   Sexually transmitted infections (STIs).   Allergic reaction.   Medicines or birth control devices that are put in the vagina.   Injury to the cervix.   Bacterial infections.  RISK FACTORS You are at greater risk if you:  Have unprotected sexual intercourse.  Have sexual intercourse with many partners.  Began sexual intercourse at an early age.  Have a history of STIs. SYMPTOMS  There may be no symptoms. If symptoms occur, they may include:   Gray, white, yellow, or bad-smelling vaginal discharge.   Pain or itching of the area outside the vagina.   Painful sexual intercourse.   Lower abdominal or lower back pain, especially during intercourse.   Frequent urination.   Abnormal vaginal bleeding between periods, after sexual intercourse, or after menopause.   Pressure or a heavy feeling in the pelvis.  DIAGNOSIS  Diagnosis is made after a pelvic exam. Other tests may include:   Examination of any discharge under a microscope (wet prep).   A Pap test.  TREATMENT  Treatment will depend on the cause of cervicitis. If it is caused by an STI, both you and your partner will need to be treated. Antibiotic medicines will be given.  HOME CARE INSTRUCTIONS   Do not have sexual intercourse until your health care provider says it is okay.   Do not have sexual intercourse until your partner has been treated, if your cervicitis is caused by an STI.   Take your antibiotics as directed. Finish them even if you start to feel better.  SEEK MEDICAL CARE IF:  Your symptoms come back.   You have a fever.  MAKE SURE YOU:   Understand these instructions.  Will watch your condition.  Will get help right away if you are not doing well or get worse.   This information is not intended to replace  advice given to you by your health care provider. Make sure you discuss any questions you have with your health care provider.   Document Released: 02/02/2005 Document Revised: 02/07/2013 Document Reviewed: 07/27/2012 Elsevier Interactive Patient Education 2016 Elsevier Inc.  Urinary Tract Infection Urinary tract infections (UTIs) can develop anywhere along your urinary tract. Your urinary tract is your body's drainage system for removing wastes and extra water. Your urinary tract includes two kidneys, two ureters, a bladder, and a urethra. Your kidneys are a pair of bean-shaped organs. Each kidney is about the size of your fist. They are located below your ribs, one on each side of your spine. CAUSES Infections are caused by microbes, which are microscopic organisms, including fungi, viruses, and bacteria. These organisms are so small that they can only be seen through a microscope. Bacteria are the microbes that most commonly cause UTIs. SYMPTOMS  Symptoms of UTIs may vary by age and gender of the patient and by the location of the infection. Symptoms in young women typically include a frequent and intense urge to urinate and a painful, burning feeling in the bladder or urethra during urination. Older women and men are more likely to be tired, shaky, and weak and have muscle aches and abdominal pain. A fever may mean the infection is in your kidneys. Other symptoms of a kidney infection include pain in your back or sides below the ribs, nausea, and vomiting. DIAGNOSIS To diagnose a UTI, your caregiver  will ask you about your symptoms. Your caregiver will also ask you to provide a urine sample. The urine sample will be tested for bacteria and white blood cells. White blood cells are made by your body to help fight infection. TREATMENT  Typically, UTIs can be treated with medication. Because most UTIs are caused by a bacterial infection, they usually can be treated with the use of antibiotics. The  choice of antibiotic and length of treatment depend on your symptoms and the type of bacteria causing your infection. HOME CARE INSTRUCTIONS  If you were prescribed antibiotics, take them exactly as your caregiver instructs you. Finish the medication even if you feel better after you have only taken some of the medication.  Drink enough water and fluids to keep your urine clear or pale yellow.  Avoid caffeine, tea, and carbonated beverages. They tend to irritate your bladder.  Empty your bladder often. Avoid holding urine for long periods of time.  Empty your bladder before and after sexual intercourse.  After a bowel movement, women should cleanse from front to back. Use each tissue only once. SEEK MEDICAL CARE IF:   You have back pain.  You develop a fever.  Your symptoms do not begin to resolve within 3 days. SEEK IMMEDIATE MEDICAL CARE IF:   You have severe back pain or lower abdominal pain.  You develop chills.  You have nausea or vomiting.  You have continued burning or discomfort with urination. MAKE SURE YOU:   Understand these instructions.  Will watch your condition.  Will get help right away if you are not doing well or get worse.   This information is not intended to replace advice given to you by your health care provider. Make sure you discuss any questions you have with your health care provider.   Document Released: 11/12/2004 Document Revised: 10/24/2014 Document Reviewed: 03/13/2011 Elsevier Interactive Patient Education 2016 [employer].  Sexually Transmitted Disease A sexually transmitted disease (STD) is a disease or infection that may be passed (transmitted) from person to person, usually during sexual activity. This may happen by way of saliva, semen, blood, vaginal mucus, or urine. Common STDs include:  Gonorrhea.  Chlamydia.  Syphilis.  HIV and AIDS.  Genital herpes.  Hepatitis B and C.  Trichomonas.  Human papillomavirus  (HPV).  Pubic lice.  Scabies.  Mites.  Bacterial vaginosis. WHAT ARE CAUSES OF STDs? An STD may be caused by bacteria, a virus, or parasites. STDs are often transmitted during sexual activity if one person is infected. However, they may also be transmitted through nonsexual means. STDs may be transmitted after:   Sexual intercourse with an infected person.  Sharing sex toys with an infected person.  Sharing needles with an infected person or using unclean piercing or tattoo needles.  Having intimate contact with the genitals, mouth, or rectal areas of an infected person.  Exposure to infected fluids during birth. WHAT ARE THE SIGNS AND SYMPTOMS OF STDs? Different STDs have different symptoms. Some people may not have any symptoms. If symptoms are present, they may include:  Painful or bloody urination.  Pain in the pelvis, abdomen, vagina, anus, throat, or eyes.  A skin rash, itching, or irritation.  Growths, ulcerations, blisters, or sores in the genital and anal areas.  Abnormal vaginal discharge with or without bad odor.  Penile discharge in men.  Fever.  Pain or bleeding during sexual intercourse.  Swollen glands in the groin area.  Yellow skin and eyes (jaundice). This is  seen with hepatitis.  Swollen testicles.  Infertility.  Sores and blisters in the mouth. HOW ARE STDs DIAGNOSED? To make a diagnosis, your health care provider may:  Take a medical history.  Perform a physical exam.  Take a sample of any discharge to examine.  Swab the throat, cervix, opening to the penis, rectum, or vagina for testing.  Test a sample of your first morning urine.  Perform blood tests.  Perform a Pap test, if this applies.  Perform a colposcopy.  Perform a laparoscopy. HOW ARE STDs TREATED? Treatment depends on the STD. Some STDs may be treated but not cured.  Chlamydia, gonorrhea, trichomonas, and syphilis can be cured with antibiotic medicine.  Genital  herpes, hepatitis, and HIV can be treated, but not cured, with prescribed medicines. The medicines lessen symptoms.  Genital warts from HPV can be treated with medicine or by freezing, burning (electrocautery), or surgery. Warts may come back.  HPV cannot be cured with medicine or surgery. However, abnormal areas may be removed from the cervix, vagina, or vulva.  If your diagnosis is confirmed, your recent sexual partners need treatment. This is true even if they are symptom-free or have a negative culture or evaluation. They should not have sex until their health care providers say it is okay.  Your health care provider may test you for infection again 3 months after treatment. HOW CAN I REDUCE MY RISK OF GETTING AN STD? Take these steps to reduce your risk of getting an STD:  Use latex condoms, dental dams, and water-soluble lubricants during sexual activity. Do not use petroleum jelly or oils.  Avoid having multiple sex partners.  Do not have sex with someone who has other sex partners  Do not have sex with anyone you do not know or who is at high risk for an STD.  Avoid risky sex practices that can break your skin.  Do not have sex if you have open sores on your mouth or skin.  Avoid drinking too much alcohol or taking illegal drugs. Alcohol and drugs can affect your judgment and put you in a vulnerable position.  Avoid engaging in oral and anal sex acts.  Get vaccinated for HPV and hepatitis. If you have not received these vaccines in the past, talk to your health care provider about whether one or both might be right for you.  If you are at risk of being infected with HIV, it is recommended that you take a prescription medicine daily to prevent HIV infection. This is called pre-exposure prophylaxis (PrEP). You are considered at risk if:  You are a man who has sex with other men (MSM).  You are a heterosexual man or woman and are sexually active with more than one  partner.  You take drugs by injection.  You are sexually active with a partner who has HIV.  Talk with your health care provider about whether you are at high risk of being infected with HIV. If you choose to begin PrEP, you should first be tested for HIV. You should then be tested every 3 months for as long as you are taking PrEP. WHAT SHOULD I DO IF I THINK I HAVE AN STD?  See your health care provider.  Tell your sexual partner(s). They should be tested and treated for any STDs.  Do not have sex until your health care provider says it is okay. WHEN SHOULD I GET IMMEDIATE MEDICAL CARE? Contact your health care provider right away if:  You have severe abdominal pain.  You are a man and notice swelling or pain in your testicles.  You are a woman and notice swelling or pain in your vagina.   This information is not intended to replace advice given to you by your health care provider. Make sure you discuss any questions you have with your health care provider.   Document Released: 04/25/2002 Document Revised: 02/23/2014 Document Reviewed: 08/23/2012 Elsevier Interactive Patient Education [employer]2016 Elsevier Inc.

## 2015-01-02 LAB — GC/CHLAMYDIA PROBE AMP (~~LOC~~) NOT AT ARMC
Chlamydia: POSITIVE — AB
NEISSERIA GONORRHEA: POSITIVE — AB

## 2015-01-02 LAB — HIV ANTIBODY (ROUTINE TESTING W REFLEX): HIV Screen 4th Generation wRfx: NONREACTIVE

## 2015-01-03 ENCOUNTER — Telehealth (HOSPITAL_BASED_OUTPATIENT_CLINIC_OR_DEPARTMENT_OTHER): Payer: Self-pay | Admitting: Emergency Medicine

## 2015-02-15 ENCOUNTER — Encounter (HOSPITAL_COMMUNITY): Payer: Self-pay

## 2015-02-15 ENCOUNTER — Emergency Department (INDEPENDENT_AMBULATORY_CARE_PROVIDER_SITE_OTHER)
Admission: EM | Admit: 2015-02-15 | Discharge: 2015-02-15 | Disposition: A | Discharge status: Home / Self Care | Payer: 59 | Source: Home / Self Care

## 2015-02-15 ENCOUNTER — Other Ambulatory Visit (HOSPITAL_COMMUNITY)
Admission: RE | Admit: 2015-02-15 | Discharge: 2015-02-15 | Disposition: A | Discharge status: Home / Self Care | Payer: 59 | Source: Ambulatory Visit | Attending: Family Medicine | Admitting: Family Medicine

## 2015-02-15 DIAGNOSIS — Z113 Encounter for screening for infections with a predominantly sexual mode of transmission: Secondary | ICD-10-CM | POA: Diagnosis not present

## 2015-02-15 DIAGNOSIS — N390 Urinary tract infection, site not specified: Secondary | ICD-10-CM

## 2015-02-15 DIAGNOSIS — N760 Acute vaginitis: Secondary | ICD-10-CM | POA: Diagnosis present

## 2015-02-15 HISTORY — DX: Gonococcal infection, unspecified: A54.9

## 2015-02-15 HISTORY — DX: Chlamydial infection, unspecified: A74.9

## 2015-02-15 LAB — POCT PREGNANCY, URINE: Preg Test, Ur: NEGATIVE

## 2015-02-15 LAB — POCT URINALYSIS DIP (DEVICE)
Glucose, UA: 100 mg/dL — AB
KETONES UR: NEGATIVE mg/dL
Leukocytes, UA: NEGATIVE
NITRITE: POSITIVE — AB
PH: 5 (ref 5.0–8.0)
Protein, ur: 30 mg/dL — AB
Urobilinogen, UA: 1 mg/dL (ref 0.0–1.0)

## 2015-02-15 MED ORDER — CEPHALEXIN 500 MG PO CAPS: 500.0000 mg | ORAL_CAPSULE | Freq: Four times a day (QID) | ORAL | Status: DC

## 2015-02-15 NOTE — ED Provider Notes (Signed)
CSN: [id number]     Arrival date & time 02/15/15  1300 History   None    No chief complaint on file.  (Consider location/radiation/quality/duration/timing/severity/associated sxs/prior Treatment) HPI History obtained from patient:   LOCATION: vaginal SEVERITY:2 DURATION: Several days CONTEXT: Sudden onset. Patient states that about one month ago she was treated for gonorrhea and chlamydia in some of her symptoms are similar to what she was experiencing at that time and is asking for testing at this time. She states that she was treated with an injection and pills [facility]. QUALITY: MODIFYING FACTORS: none ASSOCIATED SYMPTOMS:buring and odor to urine TIMING: Constant  OCCUPATION:  No past medical history on file. Past Surgical History  Procedure Laterality Date  . Facial nerve decompression     Family History  Problem Relation Age of Onset  . Diabetes Father    Social History  Substance Use Topics  . Smoking status: Former Smoker    Quit date: 11/27/2013  . Smokeless tobacco: Current User     Comment: e-cigarettes  . Alcohol Use: No   OB History    No data available     Review of Systems ROS +'ve frequency, burning  Denies: HEADACHE, NAUSEA, ABDOMINAL PAIN, CHEST PAIN, CONGESTION, SHORTNESS OF BREATH  Allergies  Sulfa antibiotics  Home Medications   Prior to Admission medications   Medication Sig Start Date End Date Taking? Authorizing Provider  amoxicillin (AMOXIL) 500 MG capsule Take 1 capsule (500 mg total) by mouth 3 (three) times daily. Patient not taking: Reported on 02/07/2014 12/04/13   Joni ReiningNicole Pisciotta, PA-C  cephALEXin (KEFLEX) 500 MG capsule Take 1 capsule (500 mg total) by mouth 2 (two) times daily. 01/01/15   Cheri FowlerKayla Rose, PA-C  ciprofloxacin (CIPRO) 500 MG tablet Take 1 tablet (500 mg total) by mouth 2 (two) times daily. 02/18/14   Richardean Canalavid H Yao, MD  HYDROcodone-acetaminophen (NORCO/VICODIN) 5-325 MG per tablet Take 1 tablet by mouth every 6  (six) hours as needed for moderate pain or severe pain. 02/18/14   Richardean Canalavid H Yao, MD  metoCLOPramide (REGLAN) 10 MG tablet Take 1 tablet (10 mg total) by mouth every 6 (six) hours. 02/18/14   Richardean Canalavid H Yao, MD  metroNIDAZOLE (FLAGYL) 500 MG tablet Take 1 tablet (500 mg total) by mouth 2 (two) times daily. 01/01/15   Cheri FowlerKayla Rose, PA-C  naproxen sodium (ANAPROX) 220 MG tablet Take 440 mg by mouth 2 (two) times daily as needed (pain).    Historical Provider, MD  ondansetron (ZOFRAN) 4 MG tablet Take 1 tablet (4 mg total) by mouth every 6 (six) hours. 12/04/13   Antony MaduraKelly Humes, PA-C  oxyCODONE-acetaminophen (PERCOCET/ROXICET) 5-325 MG per tablet 1 to 2 tabs PO q6hrs  PRN for pain Patient not taking: Reported on 02/07/2014 12/04/13   Joni ReiningNicole Pisciotta, PA-C  penicillin v potassium (VEETID) 500 MG tablet Take 1 tablet (500 mg total) by mouth 4 (four) times daily. 02/07/14   Earley FavorGail Schulz, NP  traMADol (ULTRAM) 50 MG tablet Take 1 tablet (50 mg total) by mouth every 6 (six) hours as needed. 02/07/14   Earley FavorGail Schulz, NP   Meds Ordered and Administered this Visit  Medications - No data to display  There were no vitals taken for this visit. No data found.   Physical Exam  Constitutional: She is oriented to person, place, and time. She appears well-developed and well-nourished.  HENT:  Head: Normocephalic and atraumatic.  Eyes: Conjunctivae are normal.  Neck: Normal range of motion. Neck supple.  Pulmonary/Chest:  Effort normal and breath sounds normal.  Abdominal: Soft. Bowel sounds are normal. There is no tenderness.  No CVA  Genitourinary:  Pelvic examination is done with patient's permission in female chaperone present Small amount of vaginal discharge is noted. No significant odor per appreciated. Cervix is nontender. There is no adnexal tenderness.  Musculoskeletal: Normal range of motion.  Neurological: She is alert and oriented to person, place, and time.  Skin: Skin is warm and dry.  Psychiatric: She  has a normal mood and affect. Her behavior is normal. Judgment and thought content normal.  Nursing note and vitals reviewed.   ED Course  Procedures (including critical care time)  Labs Review Labs Reviewed  POCT URINALYSIS DIP (DEVICE) - Abnormal; Notable for the following:    Glucose, UA 100 (*)    Bilirubin Urine SMALL (*)    Hgb urine dipstick TRACE (*)    Protein, ur 30 (*)    Nitrite POSITIVE (*)    All other components within normal limits  POCT PREGNANCY, URINE  CERVICOVAGINAL ANCILLARY ONLY    Imaging Review No results found.   Visual Acuity Review  Right Eye Distance:   Left Eye Distance:   Bilateral Distance:    Right Eye Near:   Left Eye Near:    Bilateral Near:         MDM   1. UTI (lower urinary tract infection)    Urinalysis is consistent with UTI. Prescription for Lasix as prescribed. Patient is advised to contact the Center next week for STD culture results. If positive will treat at that time. Instructions of care provided discharged home in stable condition.    Tharon Aquas, PA 02/15/15 1420

## 2015-02-15 NOTE — ED Notes (Signed)
Recent GC, chlamydia. Broke up w partner, back w him, now back again. Concerned about another GC/chlamydia again, c/o burning itching

## 2015-02-15 NOTE — Discharge Instructions (Signed)

## 2015-02-17 NOTE — ED Notes (Signed)
January 1 , 2017, 4:23 pm : Patient in lobby asking about labs. Std labs not back, no results at this time

## 2015-02-18 ENCOUNTER — Emergency Department (HOSPITAL_BASED_OUTPATIENT_CLINIC_OR_DEPARTMENT_OTHER)
Admission: EM | Admit: 2015-02-18 | Discharge: 2015-02-18 | Disposition: A | Discharge status: Home / Self Care | Payer: BLUE CROSS/BLUE SHIELD | Attending: Emergency Medicine | Admitting: Emergency Medicine

## 2015-02-18 ENCOUNTER — Encounter (HOSPITAL_BASED_OUTPATIENT_CLINIC_OR_DEPARTMENT_OTHER): Payer: Self-pay | Admitting: *Deleted

## 2015-02-18 DIAGNOSIS — R300 Dysuria: Secondary | ICD-10-CM | POA: Diagnosis not present

## 2015-02-18 DIAGNOSIS — Z3202 Encounter for pregnancy test, result negative: Secondary | ICD-10-CM | POA: Diagnosis not present

## 2015-02-18 DIAGNOSIS — Z8619 Personal history of other infectious and parasitic diseases: Secondary | ICD-10-CM | POA: Diagnosis not present

## 2015-02-18 DIAGNOSIS — Z87891 Personal history of nicotine dependence: Secondary | ICD-10-CM | POA: Diagnosis not present

## 2015-02-18 DIAGNOSIS — Z79899 Other long term (current) drug therapy: Secondary | ICD-10-CM | POA: Insufficient documentation

## 2015-02-18 DIAGNOSIS — Z792 Long term (current) use of antibiotics: Secondary | ICD-10-CM | POA: Insufficient documentation

## 2015-02-18 LAB — URINALYSIS, ROUTINE W REFLEX MICROSCOPIC
GLUCOSE, UA: NEGATIVE mg/dL
Ketones, ur: 15 mg/dL — AB
Nitrite: NEGATIVE
PROTEIN: NEGATIVE mg/dL
Specific Gravity, Urine: 1.038 — ABNORMAL HIGH (ref 1.005–1.030)
pH: 5.5 (ref 5.0–8.0)

## 2015-02-18 LAB — URINE MICROSCOPIC-ADD ON

## 2015-02-18 LAB — WET PREP, GENITAL
Clue Cells Wet Prep HPF POC: NONE SEEN
SPERM: NONE SEEN
TRICH WET PREP: NONE SEEN
Yeast Wet Prep HPF POC: NONE SEEN

## 2015-02-18 LAB — PREGNANCY, URINE: Preg Test, Ur: NEGATIVE

## 2015-02-18 MED ORDER — PHENAZOPYRIDINE HCL 200 MG PO TABS: 200.0000 mg | ORAL_TABLET | Freq: Three times a day (TID) | ORAL | Status: DC

## 2015-02-18 NOTE — ED Notes (Signed)
C/o uti dx on wed last week and tested for stds. C/o irritation and cloudy urine. No discharge.

## 2015-02-18 NOTE — ED Notes (Signed)
Patient called, inquiring about her lab reports. After verifying ID, discussed w her all the negative reports. Do not have STD reports as yet, and since she is anxious about the outcome, will call as soon as report is published

## 2015-02-18 NOTE — ED Provider Notes (Signed)
CSN: [id number]     Arrival date & time 02/18/15  0715 History   First MD Initiated Contact with Patient 02/18/15 [phone]     Chief Complaint  Patient presents with  . Urinary Tract Infection     (Consider location/radiation/quality/duration/timing/severity/associated sxs/prior Treatment) HPI Comments: Patient presents with urinary symptoms. She reports that she was treated for gonorrhea and chlamydia with Rocephin and Zithromax on November 15. She is currently sexually active with the same gentleman, although he has been treated as well. She states that she was seen at [facility] urgent care on December 30 and diagnosed with the UTI. She had a pelvic exam at that point which did not show any significant discharge. She is still waiting for the results of her GC and chlamydia on that visit. She reports that she has been taking Keflex for her UTI. She still feels discomfort in her lower abdomen. She feels some burning on urination. She feels like her urine is cloudy. She denies any vaginal bleeding or discharge. There is no nausea vomiting or fevers.  Patient is a 35 y.o. female presenting with urinary tract infection.  Urinary Tract Infection Associated symptoms: no abdominal pain, no fever, no flank pain, no nausea, no vaginal discharge and no vomiting     Past Medical History  Diagnosis Date  . Chlamydia   . Gonococcal infection    Past Surgical History  Procedure Laterality Date  . Facial nerve decompression     Family History  Problem Relation Age of Onset  . Diabetes Father    Social History  Substance Use Topics  . Smoking status: Former Smoker    Quit date: 11/27/2013  . Smokeless tobacco: Current User     Comment: e-cigarettes  . Alcohol Use: No   OB History    No data available     Review of Systems  Constitutional: Negative for fever, chills, diaphoresis and fatigue.  HENT: Negative for congestion, rhinorrhea and sneezing.   Eyes: Negative.   Respiratory: Negative  for cough, chest tightness and shortness of breath.   Cardiovascular: Negative for chest pain and leg swelling.  Gastrointestinal: Negative for nausea, vomiting, abdominal pain, diarrhea and blood in stool.  Genitourinary: Positive for dysuria. Negative for frequency, hematuria, flank pain, vaginal bleeding, vaginal discharge and difficulty urinating.  Musculoskeletal: Negative for back pain and arthralgias.  Skin: Negative for rash.  Neurological: Negative for dizziness, speech difficulty, weakness, numbness and headaches.      Allergies  Sulfa antibiotics  Home Medications   Prior to Admission medications   Medication Sig Start Date End Date Taking? Authorizing Provider  cephALEXin (KEFLEX) 500 MG capsule Take 1 capsule (500 mg total) by mouth 4 (four) times daily. 02/15/15  Yes Tharon Aquas, PA  metroNIDAZOLE (FLAGYL) 500 MG tablet Take 500 mg by mouth 3 (three) times daily.   Yes Historical Provider, MD  phenazopyridine (PYRIDIUM) 200 MG tablet Take 1 tablet (200 mg total) by mouth 3 (three) times daily. 02/18/15   Rolan Bucco, MD   BP 139/94 mmHg  Pulse 94  Temp(Src) 98.3 F (36.8 C) (Oral)  Resp 16  Ht 5\' 5"  (1.651 m)  Wt 155 lb (70.308 kg)  BMI 25.79 kg/m2  SpO2 100%  LMP 02/11/2015 Physical Exam  Constitutional: She is oriented to person, place, and time. She appears well-developed and well-nourished.  HENT:  Head: Normocephalic and atraumatic.  Eyes: Pupils are equal, round, and reactive to light.  Neck: Normal range of motion. Neck supple.  Cardiovascular: Normal rate, regular rhythm and normal heart sounds.   Pulmonary/Chest: Effort normal and breath sounds normal. No respiratory distress. She has no wheezes. She has no rales. She exhibits no tenderness.  Abdominal: Soft. Bowel sounds are normal. There is no tenderness. There is no rebound and no guarding.  Genitourinary:  No external lesions are noted. There is scant vaginal discharge. No cervical motion  tenderness or adnexal tenderness is noted.  Musculoskeletal: Normal range of motion. She exhibits no edema.  Lymphadenopathy:    She has no cervical adenopathy.  Neurological: She is alert and oriented to person, place, and time.  Skin: Skin is warm and dry. No rash noted.  Psychiatric: She has a normal mood and affect.    ED Course  Procedures (including critical care time) Labs Review Labs Reviewed  WET PREP, GENITAL - Abnormal; Notable for the following:    WBC, Wet Prep HPF POC FEW (*)    All other components within normal limits  URINALYSIS, ROUTINE W REFLEX MICROSCOPIC (NOT [facility]) - Abnormal; Notable for the following:    Color, Urine AMBER (*)    APPearance CLOUDY (*)    Specific Gravity, Urine 1.038 (*)    Hgb urine dipstick LARGE (*)    Bilirubin Urine SMALL (*)    Ketones, ur 15 (*)    Leukocytes, UA SMALL (*)    All other components within normal limits  URINE MICROSCOPIC-ADD ON - Abnormal; Notable for the following:    Squamous Epithelial / LPF 6-30 (*)    Bacteria, UA MANY (*)    All other components within normal limits  URINE CULTURE  PREGNANCY, URINE  GC/CHLAMYDIA PROBE AMP ([place]) NOT AT [facility]    Imaging Review No results found. I have personally reviewed and evaluated these images and lab results as part of my medical decision-making.   EKG Interpretation None      MDM   Final diagnoses:  Dysuria    Patient presents with some discomfort on urination. Her pelvic exam is unremarkable with no significant discharge. She was recently diagnosed with a UTI and this is her third day of antibiotics on Keflex. Her urine has some signs of infection but it also looks like a dirty specimen. She has no abdominal tenderness or indications of a kidney stone. She's otherwise well-appearing. I sent her urine for cultures and also did a vaginal swab for GC and Chlamydia. I did not change her antibiotics at this point since she recently started Keflex. I also did  not presumptively treat her for GC and Chlamydia she's recently been treated and her pelvic exam was unremarkable.  Swabs are pending.  She states that she does have a gynecologist and I advised her to follow-up with her gynecologist within a week if her symptoms are not improving. She was advised to return here if she has any worsening symptoms. She was given a prescription for Pyridium to help with the urinary symptoms.    Rolan BuccoMelanie Sheretta Grumbine, MD 02/18/15 [phone]

## 2015-02-18 NOTE — Discharge Instructions (Signed)

## 2015-02-19 ENCOUNTER — Telehealth (HOSPITAL_COMMUNITY): Payer: Self-pay

## 2015-02-19 LAB — URINE CULTURE
Culture: 5000
Special Requests: NORMAL

## 2015-02-19 LAB — CERVICOVAGINAL ANCILLARY ONLY
CHLAMYDIA, DNA PROBE: NEGATIVE
Neisseria Gonorrhea: NEGATIVE
Wet Prep (BD Affirm): NEGATIVE

## 2015-02-19 NOTE — Telephone Encounter (Signed)
Pt calling for STD results.  ID verified x 2.  Pt informed both Gonorrhea and Chlamydia are are still pending.  Urine cx also pending.

## 2015-02-20 LAB — GC/CHLAMYDIA PROBE AMP (~~LOC~~) NOT AT ARMC
Chlamydia: NEGATIVE
Neisseria Gonorrhea: NEGATIVE

## 2015-03-27 ENCOUNTER — Encounter (HOSPITAL_COMMUNITY): Payer: Self-pay | Admitting: Emergency Medicine

## 2015-03-27 ENCOUNTER — Emergency Department (HOSPITAL_COMMUNITY)
Admission: EM | Admit: 2015-03-27 | Discharge: 2015-03-28 | Disposition: A | Discharge status: Home / Self Care | Payer: BLUE CROSS/BLUE SHIELD | Attending: Emergency Medicine | Admitting: Emergency Medicine

## 2015-03-27 DIAGNOSIS — S00412A Abrasion of left ear, initial encounter: Secondary | ICD-10-CM | POA: Diagnosis not present

## 2015-03-27 DIAGNOSIS — Z87891 Personal history of nicotine dependence: Secondary | ICD-10-CM | POA: Insufficient documentation

## 2015-03-27 DIAGNOSIS — Y9289 Other specified places as the place of occurrence of the external cause: Secondary | ICD-10-CM | POA: Diagnosis not present

## 2015-03-27 DIAGNOSIS — Z8619 Personal history of other infectious and parasitic diseases: Secondary | ICD-10-CM | POA: Diagnosis not present

## 2015-03-27 DIAGNOSIS — Z792 Long term (current) use of antibiotics: Secondary | ICD-10-CM | POA: Insufficient documentation

## 2015-03-27 DIAGNOSIS — X58XXXA Exposure to other specified factors, initial encounter: Secondary | ICD-10-CM | POA: Insufficient documentation

## 2015-03-27 DIAGNOSIS — Y9389 Activity, other specified: Secondary | ICD-10-CM | POA: Insufficient documentation

## 2015-03-27 DIAGNOSIS — Z79899 Other long term (current) drug therapy: Secondary | ICD-10-CM | POA: Insufficient documentation

## 2015-03-27 DIAGNOSIS — H9212 Otorrhea, left ear: Secondary | ICD-10-CM | POA: Diagnosis present

## 2015-03-27 DIAGNOSIS — Y998 Other external cause status: Secondary | ICD-10-CM | POA: Diagnosis not present

## 2015-03-27 MED ORDER — NEOMYCIN-COLIST-HC-THONZONIUM 3.3-3-10-0.5 MG/ML OT SUSP
3.0000 [drp] | Freq: Once | OTIC | Status: AC
  Administered 2015-03-28: 3 [drp] via OTIC
  Filled 2015-03-27: qty 5

## 2015-03-27 NOTE — Discharge Instructions (Signed)
You have an abrasion inside your ear You have been give Cortisporin drop place 3-4 drops into the ear 3 times a day for 3 days Follow up with your PCP as needed  Abrasion An abrasion is a cut or scrape on the surface of your skin. An abrasion does not go through all of the layers of your skin. It is important to take good care of your abrasion to prevent infection. HOME CARE Medicines  Take or apply medicines only as told by your doctor.  If you were prescribed an antibiotic ointment, finish all of it even if you start to feel better. Wound Care  Clean the wound with mild soap and water 2-3 times per day or as told by your doctor. Pat your wound dry with a clean towel. Do not rub it.  There are many ways to close and cover a wound. Follow instructions from your doctor about:  How to take care of your wound.  When and how you should change your bandage (dressing).  When and how you should take off your dressing.  Check your wound every day for signs of infection. Watch for:  Redness, swelling, or pain.  Fluid, blood, or pus. General Instructions  Keep the dressing dry as told by your doctor. Do not take baths, swim, use a hot tub, or do anything that would put your wound underwater until your doctor says it is okay.  If there is swelling, raise (elevate) the injured area above the level of your heart while you are sitting or lying down.  Keep all follow-up visits as told by your doctor. This is important. GET HELP IF:  You were given a tetanus shot and you have any of these where the needle went in:  Swelling.  Very bad pain.  Redness.  Bleeding.  Medicine does not help your pain.  You have any of these at the site of the wound:  More redness.  More swelling.  More pain. GET HELP RIGHT AWAY IF:  You have a red streak going away from your wound.  You have a fever.  You have fluid, blood, or pus coming from your wound.  There is a bad smell coming from  your wound.   This information is not intended to replace advice given to you by your health care provider. Make sure you discuss any questions you have with your health care provider.   Document Released: 07/22/2007 Document Revised: 06/19/2014 Document Reviewed: 01/31/2014 Elsevier Interactive Patient Education [employer]

## 2015-03-27 NOTE — ED Provider Notes (Signed)
CSN: [id number]     Arrival date & time 03/27/15  2330 History  By signing my name below, I, Shannon Landry, attest that this documentation has been prepared under the direction and in the presence of Earley Favor, NP. Electronically Signed: Gonzella Landry, Scribe. 03/27/2015. 11:44 PM.  Chief Complaint  Patient presents with  . Ear Drainage   The history is provided by the patient. No language interpreter was used.   HPI Comments: Shannon Landry is a 35 y.o. 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 Pt also reports associated, mild left ear pain.  Past Medical History  Diagnosis Date  . Chlamydia   . Gonococcal infection    Past Surgical History  Procedure Laterality Date  . Facial nerve decompression     Family History  Problem Relation Age of Onset  . Diabetes Father    Social History  Substance Use Topics  . Smoking status: Former Smoker    Quit date: 11/27/2013  . Smokeless tobacco: Current User     Comment: e-cigarettes  . Alcohol Use: No   OB History    No data available     Review of Systems  HENT: Positive for ear discharge and ear pain.        Drainage of blood from left ear  All other systems reviewed and are negative.  Allergies  Sulfa antibiotics  Home Medications   Prior to Admission medications   Medication Sig Start Date End Date Taking? Authorizing Provider  cephALEXin (KEFLEX) 500 MG capsule Take 1 capsule (500 mg total) by mouth 4 (four) times daily. 02/15/15   Tharon Aquas, PA  metroNIDAZOLE (FLAGYL) 500 MG tablet Take 500 mg by mouth 3 (three) times daily.    Historical Provider, MD  phenazopyridine (PYRIDIUM) 200 MG tablet Take 1 tablet (200 mg total) by  mouth 3 (three) times daily. 02/18/15   Rolan Bucco, MD   BP 139/94 mmHg  Pulse 104  Temp(Src) 98.4 F (36.9 C) (Oral)  Resp 20  SpO2 98%  LMP 03/15/2015 (Exact Date) Physical Exam  Constitutional: She is oriented to person, place, and time. She appears well-developed and well-nourished. No distress.  HENT:  Head: Normocephalic and atraumatic.  Right Ear: External ear normal.  Left Ear: Tympanic membrane is not perforated and not erythematous.  Abrasion of the canal Left  Eyes: Conjunctivae are normal.  Cardiovascular: Normal rate, regular rhythm and normal heart sounds.   Pulmonary/Chest: Effort normal and breath sounds normal.  Abdominal: Soft. She exhibits no distension. There is no tenderness.  Neurological: She is alert and oriented to person, place, and time.  Skin: Skin is warm and dry.  Psychiatric: She has a normal mood and affect.  Nursing note and vitals reviewed.   ED Course  Procedures  DIAGNOSTIC STUDIES:    Oxygen Saturation is 98% on RA, normal by my interpretation.   COORDINATION OF CARE:  11:37 PM Will prescribe pt ear drops. Discussed treatment plan with pt at bedside and pt agreed to plan.   MDM  Ear cleaned of blood to visualize abrasion to lower portion of Left ear canal Patient  givne Cortisporin drop to use 3 times a  day for 3 days and FU with PCP Final diagnoses:  Ear abrasion, left, initial encounter    I personally performed the services described in this documentation, which was scribed in my presence. The recorded information has been reviewed and is accurate.   Earley Favor, NP 03/28/15 2130  Cy Blamer, MD 03/28/15 2350

## 2015-03-27 NOTE — ED Notes (Signed)
Pt states she was cleaning her ears tonight with a Qtip and put it in too far  Pt states she laid down and had blood on her pillow from her left ear  Pt is c/o pain

## 2015-03-28 NOTE — ED Notes (Signed)
Patient was alert, oriented and stable upon discharge. RN went over AVS and patient had no further questions.  

## 2015-09-10 ENCOUNTER — Encounter (HOSPITAL_BASED_OUTPATIENT_CLINIC_OR_DEPARTMENT_OTHER): Payer: Self-pay

## 2015-09-10 ENCOUNTER — Emergency Department (HOSPITAL_BASED_OUTPATIENT_CLINIC_OR_DEPARTMENT_OTHER)
Admission: EM | Admit: 2015-09-10 | Discharge: 2015-09-10 | Disposition: A | Discharge status: Home / Self Care | Payer: 59 | Attending: Emergency Medicine | Admitting: Emergency Medicine

## 2015-09-10 DIAGNOSIS — N390 Urinary tract infection, site not specified: Secondary | ICD-10-CM | POA: Insufficient documentation

## 2015-09-10 DIAGNOSIS — F17210 Nicotine dependence, cigarettes, uncomplicated: Secondary | ICD-10-CM | POA: Insufficient documentation

## 2015-09-10 DIAGNOSIS — R110 Nausea: Secondary | ICD-10-CM | POA: Insufficient documentation

## 2015-09-10 DIAGNOSIS — N760 Acute vaginitis: Secondary | ICD-10-CM | POA: Insufficient documentation

## 2015-09-10 DIAGNOSIS — B9689 Other specified bacterial agents as the cause of diseases classified elsewhere: Secondary | ICD-10-CM

## 2015-09-10 LAB — URINE MICROSCOPIC-ADD ON

## 2015-09-10 LAB — WET PREP, GENITAL
Sperm: NONE SEEN
Trich, Wet Prep: NONE SEEN
YEAST WET PREP: NONE SEEN

## 2015-09-10 LAB — URINALYSIS, ROUTINE W REFLEX MICROSCOPIC
Bilirubin Urine: NEGATIVE
Glucose, UA: NEGATIVE mg/dL
HGB URINE DIPSTICK: NEGATIVE
Ketones, ur: NEGATIVE mg/dL
LEUKOCYTES UA: NEGATIVE
NITRITE: POSITIVE — AB
PROTEIN: NEGATIVE mg/dL
SPECIFIC GRAVITY, URINE: 1.022 (ref 1.005–1.030)
pH: 6 (ref 5.0–8.0)

## 2015-09-10 LAB — PREGNANCY, URINE: PREG TEST UR: NEGATIVE

## 2015-09-10 MED ORDER — CEPHALEXIN 500 MG PO CAPS: 500.0000 mg | ORAL_CAPSULE | Freq: Two times a day (BID) | ORAL | 0 refills | Status: DC

## 2015-09-10 MED ORDER — METRONIDAZOLE 500 MG PO TABS: 500.0000 mg | ORAL_TABLET | Freq: Two times a day (BID) | ORAL | 0 refills | Status: DC

## 2015-09-10 NOTE — ED Notes (Signed)
Patient reports burning with urination x 3 days. States she has also had crampy back pain intermittently. States she took AZO yesterday with some relief. Hx of UTIs.

## 2015-09-10 NOTE — ED Triage Notes (Signed)
C/o dysuria, vaginal d/c and abd pain-started over the weekend-NAD-steady gait

## 2015-09-10 NOTE — ED Provider Notes (Signed)
[facility] Provider Note   CSN: [id number] Arrival date & time: 09/10/15  1513  First Provider Contact:  First MD Initiated Contact with Patient 09/10/15 1603      By signing my name below, I, [facility], attest that this documentation has been prepared under the direction and in the presence of Tanaia Hawkey, PA-C. Electronically Signed: Randell Patient, ED Scribe. 09/10/15. 4:59 PM.   History   Chief Complaint Chief Complaint  Patient presents with  . Dysuria    HPI Shannon Landry is a 35 y.o. female who presents to the Emergency Department complaining of intermittent, aching, moderate left flank pain and dysuria onset 4 days ago. Pt states that her symptoms began two weeks ago with vaginal itching and white vaginal discharge when she believed that she may have had a yeast infection. She attempted OTC monistat and AZO with no improvement in symptoms. She is now having left flank pain, suprapubic abdominal pain, dysuria, dark-colored urine, and nausea without vomiting. She denies concern for current STDs but does admit to a history of GC. Denies fevers, lightheadedness, syncope, diarrhea, constipation, or any other symptoms currently.  The history is provided by the patient. No language interpreter was used.    Past Medical History:  Diagnosis Date  . Chlamydia   . Gonococcal infection     Patient Active Problem List   Diagnosis Date Noted  . 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 FACIAL NERVE DECOMPRESSION      OB History    No data available       Home Medications    Prior to Admission medications   Medication Sig Start Date End Date Taking? Authorizing Provider  cephALEXin (KEFLEX) 500 MG capsule Take 1 capsule (500 mg total) by mouth 2 (two) times daily. 09/10/15   Maegan Buller, PA-C  metroNIDAZOLE (FLAGYL) 500 MG tablet Take 500 mg by mouth 3 (three) times  daily.    Historical Provider, MD  metroNIDAZOLE (FLAGYL) 500 MG tablet Take 1 tablet (500 mg total) by mouth 2 (two) times daily. 09/10/15   Kirkland Figg, PA-C  phenazopyridine (PYRIDIUM) 200 MG tablet Take 1 tablet (200 mg total) by mouth 3 (three) times daily. 02/18/15   Rolan Bucco, MD    Family History Family History  Problem Relation Age of Onset  . Diabetes Father     Social History Social History  Substance Use Topics  . Smoking status: Current Every Day Smoker    Types: E-cigarettes  . Smokeless tobacco: Never Used     Comment: e-cigarettes  . Alcohol use No     Allergies   Sulfa antibiotics   Review of Systems Review of Systems  Constitutional: Negative for fever.  Gastrointestinal: Positive for abdominal pain and nausea. Negative for constipation, diarrhea and vomiting.  Genitourinary: Positive for dysuria, flank pain and vaginal discharge.  All other systems reviewed and are negative.    Physical Exam Updated Vital Signs BP 136/90 (BP Location: Right Arm)   Pulse 62   Temp 98.7 F (37.1 C) (Oral)   Resp 20   Ht  (1.651 m)   Wt 72.6 kg   LMP 08/25/2015   SpO2 100%   BMI 26.63 kg/m   Physical Exam  Constitutional: She is oriented to person, place, and time. She appears well-developed and well-nourished. No distress.  Nontoxic appearing  HENT:  Head: Normocephalic and atraumatic.  Eyes: Conjunctivae are  normal.  Neck: Normal range of motion.  Cardiovascular: Normal rate and regular rhythm.   Pulmonary/Chest: Effort normal and breath sounds normal. No respiratory distress.  Abdominal: Soft. Normal appearance and bowel sounds are normal. There is tenderness in the suprapubic area. There is no rigidity, no rebound, no guarding, no CVA tenderness and no tenderness at McBurney's point.  Mild suprapubic tenderness. No guarding or rebound. No CVA tenderness bilaterally.   Genitourinary: There is no rash on the right labia. There is no rash on the  left labia. Cervix exhibits no motion tenderness, no discharge and no friability. Right adnexum displays no tenderness and no fullness. Left adnexum displays no tenderness and no fullness. No tenderness in the vagina. Vaginal discharge found.  Genitourinary Comments: Exam chaperoned by female nurse. Thick, white vaginal discharge noted. No CMT or adnexal fullness or tenderness.   Musculoskeletal: Normal range of motion.  Moves all extremities spontaneously.  Neurological: She is alert and oriented to person, place, and time.  Skin: Skin is warm and dry.  Psychiatric: She has a normal mood and affect. Her behavior is normal.  Nursing note and vitals reviewed.    ED Treatments / Results  DIAGNOSTIC STUDIES: Oxygen Saturation is 99% on RA, normal by my interpretation.    COORDINATION OF CARE: 4:06 PM Discussed results of urinalysis. Will order labs to check for STDs. Will return to perform pelvic exam. Discussed treatment plan with pt at bedside and pt agreed to plan.  4:58 PM Returned to perform pelvic exam.   Labs (all labs ordered are listed, but only abnormal results are displayed) Labs Reviewed  WET PREP, GENITAL - Abnormal; Notable for the following:       Result Value   Clue Cells Wet Prep HPF POC PRESENT (*)    WBC, Wet Prep HPF POC FEW (*)    All other components within normal limits  URINALYSIS, ROUTINE W REFLEX MICROSCOPIC (NOT AT [facility]) - Abnormal; Notable for the following:    Color, Urine ORANGE (*)    Nitrite POSITIVE (*)    All other components within normal limits  URINE MICROSCOPIC-ADD ON - Abnormal; Notable for the following:    Squamous Epithelial / LPF 0-5 (*)    Bacteria, UA FEW (*)    All other components within normal limits  URINE CULTURE  PREGNANCY, URINE  HIV ANTIBODY (ROUTINE TESTING)  RPR  GC/CHLAMYDIA PROBE AMP ([place]) NOT AT [facility]    EKG  EKG Interpretation None       Procedures Procedures   Medications Ordered in ED Medications -  No data to display   Initial Impression / Assessment and Plan / ED Course  I have reviewed the triage vital signs and the nursing notes.  Pertinent labs & imaging results that were available during my care of the patient were reviewed by me and considered in my medical decision making (see chart for details).  Clinical Course  Comment By Time  Will perform pelvic exam with STD testing per patient request Alveta Heimlich, PA-C 07/8 8024    35 year old female presenting with flank pain, dysuria and vaginal discharge. Afebrile, hemodynamically stable and nontoxic appearing. Abdomen is soft, nontender without peritoneal signs. No CVA tenderness. Thick, white vaginal discharge noted on pelvic exam. Urinalysis is nitrite positive. Wet prep positive for clue cells. STD testing performed per patient request but is not requesting prophylactic treatment at this time. Will discharge with Keflex for UTI and Flagyl for BV. Patient is to follow-up with  her PCP in the next week if symptoms do not improve. Return precautions given in discharge paperwork and discussed with pt at bedside. Pt stable for discharge   Final Clinical Impressions(s) / ED Diagnoses   Final diagnoses:  UTI (lower urinary tract infection)  BV (bacterial vaginosis)    New Prescriptions New Prescriptions   CEPHALEXIN (KEFLEX) 500 MG CAPSULE    Take 1 capsule (500 mg total) by mouth 2 (two) times daily.   METRONIDAZOLE (FLAGYL) 500 MG TABLET    Take 1 tablet (500 mg total) by mouth 2 (two) times daily.   I personally performed the services described in this documentation, which was scribed in my presence. The recorded information has been reviewed and is accurate.     Alveta Heimlich, PA-C 09/10/15 1745    Rolland Porter, MD 09/22/15 1942

## 2015-09-10 NOTE — ED Notes (Signed)
Pt given rx x 2 for keflex and flagyl

## 2015-09-11 LAB — GC/CHLAMYDIA PROBE AMP (~~LOC~~) NOT AT ARMC
Chlamydia: NEGATIVE
NEISSERIA GONORRHEA: NEGATIVE

## 2015-09-11 LAB — URINE CULTURE

## 2015-09-11 LAB — RPR: RPR: NONREACTIVE

## 2015-09-11 LAB — HIV ANTIBODY (ROUTINE TESTING W REFLEX): HIV SCREEN 4TH GENERATION: NONREACTIVE

## 2015-09-25 ENCOUNTER — Emergency Department (HOSPITAL_BASED_OUTPATIENT_CLINIC_OR_DEPARTMENT_OTHER)
Admission: EM | Admit: 2015-09-25 | Discharge: 2015-09-25 | Disposition: A | Discharge status: Home / Self Care | Payer: 59 | Attending: Emergency Medicine | Admitting: Emergency Medicine

## 2015-09-25 DIAGNOSIS — F17210 Nicotine dependence, cigarettes, uncomplicated: Secondary | ICD-10-CM | POA: Insufficient documentation

## 2015-09-25 DIAGNOSIS — N390 Urinary tract infection, site not specified: Secondary | ICD-10-CM | POA: Insufficient documentation

## 2015-09-25 DIAGNOSIS — N760 Acute vaginitis: Secondary | ICD-10-CM

## 2015-09-25 LAB — URINALYSIS, ROUTINE W REFLEX MICROSCOPIC
Glucose, UA: NEGATIVE mg/dL
Ketones, ur: 15 mg/dL — AB
NITRITE: POSITIVE — AB
PH: 5 (ref 5.0–8.0)
Protein, ur: NEGATIVE mg/dL
SPECIFIC GRAVITY, URINE: 1.03 (ref 1.005–1.030)

## 2015-09-25 LAB — WET PREP, GENITAL
Sperm: NONE SEEN
Trich, Wet Prep: NONE SEEN
Yeast Wet Prep HPF POC: NONE SEEN

## 2015-09-25 LAB — URINE MICROSCOPIC-ADD ON

## 2015-09-25 LAB — PREGNANCY, URINE: Preg Test, Ur: NEGATIVE

## 2015-09-25 MED ORDER — CEFTRIAXONE SODIUM 1 G IJ SOLR
1.0000 g | Freq: Once | INTRAMUSCULAR | Status: AC
  Administered 2015-09-25: 1 g via INTRAMUSCULAR
  Filled 2015-09-25: qty 10

## 2015-09-25 MED ORDER — NITROFURANTOIN MONOHYD MACRO 100 MG PO CAPS: 100.0000 mg | ORAL_CAPSULE | Freq: Two times a day (BID) | ORAL | 0 refills | Status: DC

## 2015-09-25 MED ORDER — FLUCONAZOLE 150 MG PO TABS: 150.0000 mg | ORAL_TABLET | Freq: Once | ORAL | 0 refills | Status: AC

## 2015-09-25 MED ORDER — FLUCONAZOLE 50 MG PO TABS
150.0000 mg | ORAL_TABLET | Freq: Once | ORAL | Status: AC
Start: 2015-09-25 — End: 2015-09-25
  Administered 2015-09-25: 150 mg via ORAL
  Filled 2015-09-25: qty 1

## 2015-09-25 NOTE — Discharge Instructions (Signed)
Please seek immediate care if you develop the following: ?There is back pain.  ?Your symptoms are no better or worse in 3 days. ?There is severe back pain or lower abdominal pain.  ?You develop chills.  ?You have a fever.  ?There is nausea or vomiting.  ?There is continued burning or discomfort with urination.  ? ?

## 2015-09-25 NOTE — ED Triage Notes (Signed)
Pt states seen in ED a week ago for UTI and she thinks it is back

## 2015-09-25 NOTE — ED Provider Notes (Signed)
[facility] Provider Note   CSN: [id number] Arrival date & time: 09/25/15  1341  First Provider Contact:  First MD Initiated Contact with Patient 09/25/15 1511        History   Chief Complaint Chief Complaint  Patient presents with  . Vaginal Pain    HPI Shannon Landry is a 35 y.o. female who presents for dysuria, vaginal burning and itching. The patient was seen for UTI 2 weeks ago. She had negative STI labs and urine culture shoed multiple species. She has a hx of previous Pyelonephritis. I reviewed her urine cultures. She is a previous urine culture that was resistant to ampicillin and Zosyn and 2016.  Patient completed a course of Keflex. She states that her symptoms improved. However, 2 days ago she began having suprapubic pain, burning with urination, and then she developed intense vaginal itching, burning of the labia with urination. She denies vaginal discharge or odor. She is in a monogamous relation she with her husband. She had recent workup which was -2 weeks ago. She denies back pain, fevers, chills.  HPI  Past Medical History:  Diagnosis Date  . Chlamydia   . Gonococcal infection     Patient Active Problem List   Diagnosis Date Noted  . 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 FACIAL NERVE DECOMPRESSION      OB History    No data available       Home Medications    Prior to Admission medications   Medication Sig Start Date End Date Taking? Authorizing Provider  cephALEXin (KEFLEX) 500 MG capsule Take 1 capsule (500 mg total) by mouth 2 (two) times daily. 09/10/15   Stevi Barrett, PA-C  metroNIDAZOLE (FLAGYL) 500 MG tablet Take 500 mg by mouth 3 (three) times daily.    Historical Provider, MD  metroNIDAZOLE (FLAGYL) 500 MG tablet Take 1 tablet (500 mg total) by mouth 2 (two) times daily. 09/10/15   Stevi Barrett, PA-C  phenazopyridine (PYRIDIUM) 200 MG tablet Take 1  tablet (200 mg total) by mouth 3 (three) times daily. 02/18/15   Rolan Bucco, MD    Family History Family History  Problem Relation Age of Onset  . Diabetes Father     Social History Social History  Substance Use Topics  . Smoking status: Current Every Day Smoker    Types: E-cigarettes  . Smokeless tobacco: Never Used     Comment: e-cigarettes  . Alcohol use No     Allergies   Sulfa antibiotics   Review of Systems Review of Systems  Ten systems reviewed and are negative for acute change, except as noted in the HPI.   Physical Exam Updated Vital Signs BP 135/90   Pulse 75   Temp 98.4 F (36.9 C) (Oral)   Resp 18   Ht  (1.651 m)   Wt 70.8 kg   LMP 08/25/2015   SpO2 100%   BMI 25.96 kg/m   Physical Exam  Constitutional: She is oriented to person, place, and time. She appears well-developed and well-nourished. No distress.  HENT:  Head: Normocephalic and atraumatic.  Eyes: Conjunctivae are normal. No scleral icterus.  Neck: Normal range of motion.  Cardiovascular: Normal rate, regular rhythm and normal heart sounds.  Exam reveals no gallop and no friction rub.   No murmur heard. Pulmonary/Chest: Effort normal and breath sounds normal. No respiratory distress.  Abdominal: Soft. Bowel sounds are normal. She  exhibits no distension and no mass. There is no tenderness. There is no guarding.  Genitourinary:  Genitourinary Comments: Normal external vaginal genitalia. There is erythema and irritation starting at the introitus and along the vaginal walls without abnormal vaginal discharge. There is some mucoid cervical discharge with scant bloody streaks. No cervical motion tenderness or adnexal pain. Patient is at the end of her menstruation cycle.  Neurological: She is alert and oriented to person, place, and time.  Skin: Skin is warm and dry. She is not diaphoretic.     ED Treatments / Results  Labs (all labs ordered are listed, but only abnormal results are  displayed) Labs Reviewed  URINALYSIS, ROUTINE W REFLEX MICROSCOPIC (NOT AT [facility]) - Abnormal; Notable for the following:       Result Value   Color, Urine ORANGE (*)    APPearance CLOUDY (*)    Hgb urine dipstick LARGE (*)    Bilirubin Urine SMALL (*)    Ketones, ur 15 (*)    Nitrite POSITIVE (*)    Leukocytes, UA SMALL (*)    All other components within normal limits  URINE MICROSCOPIC-ADD ON - Abnormal; Notable for the following:    Squamous Epithelial / LPF 0-5 (*)    Bacteria, UA MANY (*)    All other components within normal limits  URINE CULTURE  WET PREP, GENITAL  PREGNANCY, URINE    EKG  EKG Interpretation None       Radiology No results found.  Procedures Procedures (including critical care time)  Medications Ordered in ED Medications  cefTRIAXone (ROCEPHIN) injection 1 g (not administered)  fluconazole (DIFLUCAN) tablet 150 mg (not administered)     Initial Impression / Assessment and Plan / ED Course  I have reviewed the triage vital signs and the nursing notes.  Pertinent labs & imaging results that were available during my care of the patient were reviewed by me and considered in my medical decision making (see chart for details).  Clinical Course  Comment By Time  Patient with UTI, sent for culture. No signs of pyelo. Will treat with macrobid. Diflucan given in ED and will repeat the dose in 1 week. F/u with ob gyn . Return for unresolved or worsening sxs. Arthor Captainbigail Taquan Bralley, PA-C 08/09 1604      Final Clinical Impressions(s) / ED Diagnoses   Final diagnoses:  None    New Prescriptions New Prescriptions   No medications on file     Arthor Captainbigail Mychael Soots, PA-C 09/25/15 1608    Tilden FossaElizabeth Rees, MD 09/26/15 [phone]

## 2015-09-26 LAB — URINE CULTURE: CULTURE: NO GROWTH

## 2016-01-28 ENCOUNTER — Encounter (HOSPITAL_COMMUNITY): Payer: Self-pay | Admitting: *Deleted

## 2016-01-28 ENCOUNTER — Inpatient Hospital Stay (HOSPITAL_COMMUNITY)
Admission: AD | Admit: 2016-01-28 | Discharge: 2016-01-28 | Disposition: A | Discharge status: Home / Self Care | Payer: BLUE CROSS/BLUE SHIELD | Source: Ambulatory Visit | Attending: Obstetrics and Gynecology | Admitting: Obstetrics and Gynecology

## 2016-01-28 DIAGNOSIS — B3731 Acute candidiasis of vulva and vagina: Secondary | ICD-10-CM

## 2016-01-28 DIAGNOSIS — Z7982 Long term (current) use of aspirin: Secondary | ICD-10-CM | POA: Insufficient documentation

## 2016-01-28 DIAGNOSIS — Z3202 Encounter for pregnancy test, result negative: Secondary | ICD-10-CM | POA: Insufficient documentation

## 2016-01-28 DIAGNOSIS — B373 Candidiasis of vulva and vagina: Secondary | ICD-10-CM

## 2016-01-28 DIAGNOSIS — F17290 Nicotine dependence, other tobacco product, uncomplicated: Secondary | ICD-10-CM | POA: Insufficient documentation

## 2016-01-28 HISTORY — DX: Headache, unspecified: R51.9

## 2016-01-28 HISTORY — DX: Headache: R51

## 2016-01-28 HISTORY — DX: Tubulo-interstitial nephritis, not specified as acute or chronic: N12

## 2016-01-28 HISTORY — DX: Essential (primary) hypertension: I10

## 2016-01-28 LAB — WET PREP, GENITAL
CLUE CELLS WET PREP: NONE SEEN
Sperm: NONE SEEN
TRICH WET PREP: NONE SEEN
WBC WET PREP: NONE SEEN
Yeast Wet Prep HPF POC: NONE SEEN

## 2016-01-28 LAB — URINALYSIS, ROUTINE W REFLEX MICROSCOPIC
Bacteria, UA: NONE SEEN
Bilirubin Urine: NEGATIVE
GLUCOSE, UA: NEGATIVE mg/dL
HGB URINE DIPSTICK: NEGATIVE
Ketones, ur: NEGATIVE mg/dL
NITRITE: NEGATIVE
PH: 5 (ref 5.0–8.0)
PROTEIN: NEGATIVE mg/dL
SPECIFIC GRAVITY, URINE: 1.026 (ref 1.005–1.030)

## 2016-01-28 LAB — POCT PREGNANCY, URINE: Preg Test, Ur: NEGATIVE

## 2016-01-28 MED ORDER — FLUCONAZOLE 150 MG PO TABS: 150.0000 mg | ORAL_TABLET | Freq: Every day | ORAL | 1 refills | Status: DC

## 2016-01-28 NOTE — MAU Provider Note (Signed)
History     CSN: [id number]  Arrival date and time: 01/28/16 1322   First Provider Initiated Contact with Patient 01/28/16 1442      Chief Complaint  Patient presents with  . Vaginal Discharge  . Urinary Urgency   HPI  Shannon Landry is a 35 y.o. female who presents for urinary urgency & vaginal discharge. Reports urgency x 3 days. Denies fever/chills, dysuria, frequency, hematuria, or flank pain.  Vaginal discharge since yesterday. Describes thick yellow discharge without odor. Some irritation present. Denies vaginal bleeding, dyspareunia, postcoital bleeding. Was tested for STIs 2 weeks ago that was negative; request gc/ct today.   Past Medical History:  Diagnosis Date  . Chlamydia   . Gonococcal infection   . Headache   . Hypertension   . Pyelonephritis     Past Surgical History:  Procedure Laterality Date  . FACIAL NERVE DECOMPRESSION      Family History  Problem Relation Age of Onset  . Diabetes Father     Social History  Substance Use Topics  . Smoking status: Current Every Day Smoker    Types: E-cigarettes  . Smokeless tobacco: Never Used     Comment: e-cigarettes  . Alcohol use No    Allergies:  Allergies  Allergen Reactions  . Sulfa Antibiotics Swelling    Prescriptions Prior to Admission  Medication Sig Dispense Refill Last Dose  . acidophilus (RISAQUAD) CAPS capsule Take 1 capsule by mouth daily.   Past Week at Unknown time  . aspirin-acetaminophen-caffeine (EXCEDRIN MIGRAINE) 250-250-65 MG tablet Take 1 tablet by mouth every 6 (six) hours as needed for headache.   01/28/2016 at Unknown time  . metroNIDAZOLE (FLAGYL) 500 MG tablet Take 500 mg by mouth 3 (three) times daily.   01/27/2016 at Unknown time  . OVER THE COUNTER MEDICATION Take 1 tablet by mouth daily. Takes over the counter Fish oil tablets   Past Week at Unknown time  . cephALEXin (KEFLEX) 500 MG capsule Take 1 capsule (500 mg total) by mouth 2 (two) times daily. (Patient not  taking: Reported on 01/28/2016) 14 capsule 0 Not Taking at Unknown time  . metroNIDAZOLE (FLAGYL) 500 MG tablet Take 1 tablet (500 mg total) by mouth 2 (two) times daily. (Patient not taking: Reported on 01/28/2016) 14 tablet 0 Not Taking at Unknown time  . nitrofurantoin, macrocrystal-monohydrate, (MACROBID) 100 MG capsule Take 1 capsule (100 mg total) by mouth 2 (two) times daily. X 7 days (Patient not taking: Reported on 01/28/2016) 14 capsule 0 Not Taking at Unknown time  . phenazopyridine (PYRIDIUM) 200 MG tablet Take 1 tablet (200 mg total) by mouth 3 (three) times daily. (Patient not taking: Reported on 01/28/2016) 6 tablet 0 Not Taking at Unknown time    Review of Systems  Constitutional: Negative.   Gastrointestinal: Negative.   Genitourinary: Positive for urgency. Negative for dysuria, flank pain, frequency and hematuria.       + vaginal discharge  Musculoskeletal: Negative for back pain.   Physical Exam   Blood pressure 137/90, pulse 81, temperature 98.6 F (37 C), temperature source Oral, resp. rate 18, weight 158 lb 6.4 oz (71.8 kg), last menstrual period 01/15/2016.  Physical Exam  Nursing note and vitals reviewed. Constitutional: She is oriented to person, place, and time. She appears well-developed and well-nourished. No distress.  HENT:  Head: Normocephalic and atraumatic.  Eyes: Conjunctivae are normal. Right eye exhibits no discharge. Left eye exhibits no discharge. No scleral icterus.  Neck: Normal range of motion.  Respiratory: Effort normal. No respiratory distress.  GI: Soft. There is no tenderness.  Genitourinary: Cervix exhibits no friability. No bleeding in the vagina. Vaginal discharge (small amount of clumpy white discharge) found.  Neurological: She is alert and oriented to person, place, and time.  Skin: Skin is warm and dry. She is not diaphoretic.  Psychiatric: She has a normal mood and affect. Her behavior is normal. Judgment and thought content normal.     MAU Course  Procedures Results for orders placed or performed during the hospital encounter of 01/28/16 (from the past 24 hour(s))  Urinalysis, Routine w reflex microscopic     Status: Abnormal   Collection Time: 01/28/16  2:00 PM  Result Value Ref Range   Color, Urine YELLOW YELLOW   APPearance CLEAR CLEAR   Specific Gravity, Urine 1.026 1.005 - 1.030   pH 5.0 5.0 - 8.0   Glucose, UA NEGATIVE NEGATIVE mg/dL   Hgb urine dipstick NEGATIVE NEGATIVE   Bilirubin Urine NEGATIVE NEGATIVE   Ketones, ur NEGATIVE NEGATIVE mg/dL   Protein, ur NEGATIVE NEGATIVE mg/dL   Nitrite NEGATIVE NEGATIVE   Leukocytes, UA TRACE (A) NEGATIVE   RBC / HPF 0-5 0 - 5 RBC/hpf   WBC, UA 0-5 0 - 5 WBC/hpf   Bacteria, UA NONE SEEN NONE SEEN   Squamous Epithelial / LPF 0-5 (A) NONE SEEN   Mucous PRESENT   Pregnancy, urine POC     Status: None   Collection Time: 01/28/16  2:04 PM  Result Value Ref Range   Preg Test, Ur NEGATIVE NEGATIVE  Wet prep, genital     Status: None   Collection Time: 01/28/16  3:27 PM  Result Value Ref Range   Yeast Wet Prep HPF POC NONE SEEN NONE SEEN   Trich, Wet Prep NONE SEEN NONE SEEN   Clue Cells Wet Prep HPF POC NONE SEEN NONE SEEN   WBC, Wet Prep HPF POC NONE SEEN NONE SEEN   Sperm NONE SEEN     MDM UPT negative U/a inconclusive; will send for urine cx GC/CT & wet prep Wet prep neg; will tx for yeast clinically  Assessment and Plan  A: 1. Vaginal yeast infection    P: Discharge home Rx diflucan Urine culture & GC/CT pending F/u with PCP as needed   Shannon Landry 01/28/2016, 2:42 PM

## 2016-01-28 NOTE — Discharge Instructions (Signed)

## 2016-01-28 NOTE — MAU Note (Signed)
Yesterday noted a light yellow d/c.  Has been urgency to pee, not really having pain.  Pee is cloudy. Had 'testing' 2 wks ago, everything was neg.

## 2016-01-28 NOTE — MAU Note (Signed)
Not in lobby

## 2016-01-29 LAB — URINE CULTURE: Culture: NO GROWTH

## 2016-01-29 LAB — GC/CHLAMYDIA PROBE AMP (~~LOC~~) NOT AT ARMC
CHLAMYDIA, DNA PROBE: NEGATIVE
Neisseria Gonorrhea: NEGATIVE

## 2016-01-31 ENCOUNTER — Telehealth (HOSPITAL_COMMUNITY): Payer: Self-pay | Admitting: *Deleted

## 2017-04-24 ENCOUNTER — Inpatient Hospital Stay (HOSPITAL_COMMUNITY)
Admission: AD | Admit: 2017-04-24 | Discharge: 2017-04-25 | Disposition: A | Discharge status: Home / Self Care | Payer: BLUE CROSS/BLUE SHIELD | Source: Ambulatory Visit | Attending: Family Medicine | Admitting: Family Medicine

## 2017-04-24 DIAGNOSIS — Z833 Family history of diabetes mellitus: Secondary | ICD-10-CM | POA: Insufficient documentation

## 2017-04-24 DIAGNOSIS — F17290 Nicotine dependence, other tobacco product, uncomplicated: Secondary | ICD-10-CM | POA: Insufficient documentation

## 2017-04-24 DIAGNOSIS — Z8619 Personal history of other infectious and parasitic diseases: Secondary | ICD-10-CM | POA: Insufficient documentation

## 2017-04-24 DIAGNOSIS — Z882 Allergy status to sulfonamides status: Secondary | ICD-10-CM | POA: Insufficient documentation

## 2017-04-24 DIAGNOSIS — Z202 Contact with and (suspected) exposure to infections with a predominantly sexual mode of transmission: Secondary | ICD-10-CM | POA: Insufficient documentation

## 2017-04-24 MED ORDER — AZITHROMYCIN 250 MG PO TABS
1000.0000 mg | ORAL_TABLET | Freq: Once | ORAL | Status: AC
  Administered 2017-04-25: 1000 mg via ORAL
  Filled 2017-04-24: qty 4

## 2017-04-24 NOTE — MAU Note (Signed)
Pt states she went to urgent care for rectal pain on Sunday. States she was told she had hemorrhoids. Did STD screen there and it was negative. Went to ED on Monday because the pain was worse. Had STD screen and was told she had Chlamydia. States she was given the meds but has had sex with the same person. States she is currently on period.

## 2017-04-25 DIAGNOSIS — Z202 Contact with and (suspected) exposure to infections with a predominantly sexual mode of transmission: Secondary | ICD-10-CM | POA: Diagnosis present

## 2017-04-25 DIAGNOSIS — F17290 Nicotine dependence, other tobacco product, uncomplicated: Secondary | ICD-10-CM | POA: Diagnosis not present

## 2017-04-25 DIAGNOSIS — Z882 Allergy status to sulfonamides status: Secondary | ICD-10-CM | POA: Diagnosis not present

## 2017-04-25 DIAGNOSIS — Z833 Family history of diabetes mellitus: Secondary | ICD-10-CM | POA: Diagnosis not present

## 2017-04-25 DIAGNOSIS — Z8619 Personal history of other infectious and parasitic diseases: Secondary | ICD-10-CM | POA: Diagnosis not present

## 2017-04-25 NOTE — Discharge Instructions (Signed)
In late 2019, the [facility] will be moving to the [facility]. At that time, the MAU (Maternity Admissions Unit), where you are being seen today, will no longer take care of non-pregnant patients. We strongly encourage you to find a doctor's office before that time, so that you can be seen with any GYN concerns, like vaginal discharge, urinary tract infection, etc.. in a timely manner.  In order to make an office visit more convenient, the Center [facility] will be offering evening hours with same-day appointments, walk-in appointments and scheduled appointments available during this time.  Center [facility] @ [facility] Hours: Monday - 8am - 7:30 pm with walk-in between 4pm- 7:30 pm Tuesday - 8 am - 5 pm (starting 05/18/17 we will be open late and accepting walk-ins from 4pm - 7:30pm) Wednesday - 8 am - 5 pm (starting 08/18/17 we will be open late and accepting walk-ins from 4pm - 7:30pm) Thursday 8 am - 5 pm (starting 11/18/17 we will be open late and accepting walk-ins from 4pm - 7:30pm) Friday 8 am - 5 pm  For an appointment please call the Center for [facility] @ [facility] at [phone]  For urgent needs, [facility] is also available for management of urgent GYN complaints such as vaginal discharge or urinary tract infections.    Sexually Transmitted Disease A sexually transmitted disease (STD) is a disease or infection that may be passed (transmitted) from person to person, usually during sexual activity. This may happen by way of saliva, semen, blood, vaginal mucus, or urine. Common STDs include:  Gonorrhea.  Chlamydia.  Syphilis.  HIV and AIDS.  Genital herpes.  Hepatitis B and C.  Trichomonas.  Human papillomavirus (HPV).  Pubic lice.  Scabies.  Mites.  Bacterial vaginosis.  What are the causes? An STD may be caused by bacteria, a virus, or parasites. STDs are often transmitted  during sexual activity if one person is infected. However, they may also be transmitted through nonsexual means. STDs may be transmitted after:  Sexual intercourse with an infected person.  Sharing sex toys with an infected person.  Sharing needles with an infected person or using unclean piercing or tattoo needles.  Having intimate contact with the genitals, mouth, or rectal areas of an infected person.  Exposure to infected fluids during birth.  What are the signs or symptoms? Different STDs have different symptoms. Some people may not have any symptoms. If symptoms are present, they may include:  Painful or bloody urination.  Pain in the pelvis, abdomen, vagina, anus, throat, or eyes.  A skin rash, itching, or irritation.  Growths, ulcerations, blisters, or sores in the genital and anal areas.  Abnormal vaginal discharge with or without bad odor.  Penile discharge in men.  Fever.  Pain or bleeding during sexual intercourse.  Swollen glands in the groin area.  Yellow skin and eyes (jaundice). This is seen with hepatitis.  Swollen testicles.  Infertility.  Sores and blisters in the mouth.  How is this diagnosed? To make a diagnosis, your health care provider may:  Take a medical history.  Perform a physical exam.  Take a sample of any discharge to examine.  Swab the throat, cervix, opening to the penis, rectum, or vagina for testing.  Test a sample of your first morning urine.  Perform blood tests.  Perform a Pap test, if this applies.  Perform a colposcopy.  Perform a laparoscopy.  How is  this treated? Treatment depends on the STD. Some STDs may be treated but not cured.  Chlamydia, gonorrhea, trichomonas, and syphilis can be cured with antibiotic medicine.  Genital herpes, hepatitis, and HIV can be treated, but not cured, with prescribed medicines. The medicines lessen symptoms.  Genital warts from HPV can be treated with medicine or by  freezing, burning (electrocautery), or surgery. Warts may come back.  HPV cannot be cured with medicine or surgery. However, abnormal areas may be removed from the cervix, vagina, or vulva.  If your diagnosis is confirmed, your recent sexual partners need treatment. This is true even if they are symptom-free or have a negative culture or evaluation. They should not have sex until their health care providers say it is okay.  Your health care provider may test you for infection again 3 months after treatment.  How is this prevented? Take these steps to reduce your risk of getting an STD:  Use latex condoms, dental dams, and water-soluble lubricants during sexual activity. Do not use petroleum jelly or oils.  Avoid having multiple sex partners.  Do not have sex with someone who has other sex partners.  Do not have sex with anyone you do not know or who is at high risk for an STD.  Avoid risky sex practices that can break your skin.  Do not have sex if you have open sores on your mouth or skin.  Avoid drinking too much alcohol or taking illegal drugs. Alcohol and drugs can affect your judgment and put you in a vulnerable position.  Avoid engaging in oral and anal sex acts.  Get vaccinated for HPV and hepatitis. If you have not received these vaccines in the past, talk to your health care provider about whether one or both might be right for you.  If you are at risk of being infected with HIV, it is recommended that you take a prescription medicine daily to prevent HIV infection. This is called pre-exposure prophylaxis (PrEP). You are considered at risk if: ? You are a man who has sex with other men (MSM). ? You are a heterosexual man or woman and are sexually active with more than one partner. ? You take drugs by injection. ? You are sexually active with a partner who has HIV.  Talk with your health care provider about whether you are at high risk of being infected with HIV. If you  choose to begin PrEP, you should first be tested for HIV. You should then be tested every 3 months for as long as you are taking PrEP.  Contact a health care provider if:  See your health care provider.  Tell your sexual partner(s). They should be tested and treated for any STDs.  Do not have sex until your health care provider says it is okay. Get help right away if: Contact your health care provider right away if:  You have severe abdominal pain.  You are a man and notice swelling or pain in your testicles.  You are a woman and notice swelling or pain in your vagina.  This information is not intended to replace advice given to you by your health care provider. Make sure you discuss any questions you have with your health care provider. Document Released: 04/25/2002 Document Revised: 08/23/2015 Document Reviewed: 08/23/2012 Elsevier Interactive Patient Education  2018 [employer].

## 2017-04-25 NOTE — MAU Provider Note (Signed)
History     CSN: [id number]  Arrival date and time: 04/24/17 2344   First Provider Initiated Contact with Patient 04/25/17 0035      Chief Complaint  Patient presents with  . Exposure to STD   HPI   Ms.Shannon Landry is a 37 y.o. female G1P0010 non pregnant female here in MAU for treatment of chlamydia. Says she was seen at the urgent care this last week. She was treated this week however was initially told everything was negative and she had sex. She then got another call that her testing was positive. She is here for treatment again. Denies pain or bleeding   OB History    Gravida Para Term Preterm AB Living   1       1     SAB TAB Ectopic Multiple Live Births     1            Past Medical History:  Diagnosis Date  . Chlamydia   . Gonococcal infection   . Headache   . Hypertension   . Pyelonephritis     Past Surgical History:  Procedure Laterality Date  . FACIAL NERVE DECOMPRESSION      Family History  Problem Relation Age of Onset  . Diabetes Father     Social History   Tobacco Use  . Smoking status: Current Every Day Smoker    Types: E-cigarettes  . Smokeless tobacco: Never Used  . Tobacco comment: e-cigarettes  Substance Use Topics  . Alcohol use: No  . Drug use: No    Allergies:  Allergies  Allergen Reactions  . Sulfa Antibiotics Swelling    Medications Prior to Admission  Medication Sig Dispense Refill Last Dose  . acidophilus (RISAQUAD) CAPS capsule Take 1 capsule by mouth daily.   Past Week at Unknown time  . aspirin-acetaminophen-caffeine (EXCEDRIN MIGRAINE) 250-250-65 MG tablet Take 1 tablet by mouth every 6 (six) hours as needed for headache.   01/28/2016 at Unknown time  . fluconazole (DIFLUCAN) 150 MG tablet Take 1 tablet (150 mg total) by mouth daily. 1 tablet 1   . metroNIDAZOLE (FLAGYL) 500 MG tablet Take 500 mg by mouth 3 (three) times daily.   01/27/2016 at Unknown time  . OVER THE COUNTER MEDICATION Take 1 tablet by mouth  daily. Takes over the counter Fish oil tablets   Past Week at Unknown time   No results found for this or any previous visit (from the past 48 hour(s)).  Review of Systems  Constitutional: Negative for fever.  Gastrointestinal: Negative for abdominal pain.  Genitourinary: Negative for vaginal bleeding and vaginal discharge.   Physical Exam   Blood pressure (!) 131/93, pulse (!) 102, temperature 98.3 F (36.8 C), temperature source Oral, resp. rate 19, height 5\' 6"  (1.676 m), weight 162 lb (73.5 kg), last menstrual period 04/15/2017, SpO2 99 %.  Physical Exam  Constitutional: She is oriented to person, place, and time. She appears well-developed and well-nourished. No distress.  HENT:  Head: Normocephalic.  Respiratory: Effort normal.  Musculoskeletal: Normal range of motion.  Neurological: She is alert and oriented to person, place, and time.  Skin: Skin is warm. She is not diaphoretic.  Psychiatric: Her behavior is normal.   MAU Course  Procedures  None  MDM  Azithromycin given PO 1 gram   Assessment and Plan   A:  1. Exposure to STD     P:  Discharge home in stable  Condition  Patient treated today Return to  MAU for emergencies only Condoms always Partner needs treatment; no sex for 7-10 days following both partners treated.   Duane Lope, NP 04/25/2017 12:43 AM

## 2017-05-17 ENCOUNTER — Emergency Department (HOSPITAL_BASED_OUTPATIENT_CLINIC_OR_DEPARTMENT_OTHER)
Admission: EM | Admit: 2017-05-17 | Discharge: 2017-05-18 | Disposition: A | Discharge status: Home / Self Care | Payer: BLUE CROSS/BLUE SHIELD | Attending: Emergency Medicine | Admitting: Emergency Medicine

## 2017-05-17 ENCOUNTER — Encounter (HOSPITAL_BASED_OUTPATIENT_CLINIC_OR_DEPARTMENT_OTHER): Payer: Self-pay

## 2017-05-17 ENCOUNTER — Emergency Department (HOSPITAL_BASED_OUTPATIENT_CLINIC_OR_DEPARTMENT_OTHER): Payer: BLUE CROSS/BLUE SHIELD

## 2017-05-17 ENCOUNTER — Other Ambulatory Visit: Payer: Self-pay

## 2017-05-17 DIAGNOSIS — F17210 Nicotine dependence, cigarettes, uncomplicated: Secondary | ICD-10-CM | POA: Diagnosis not present

## 2017-05-17 DIAGNOSIS — Z202 Contact with and (suspected) exposure to infections with a predominantly sexual mode of transmission: Secondary | ICD-10-CM

## 2017-05-17 DIAGNOSIS — R1011 Right upper quadrant pain: Secondary | ICD-10-CM | POA: Diagnosis not present

## 2017-05-17 DIAGNOSIS — K6289 Other specified diseases of anus and rectum: Secondary | ICD-10-CM | POA: Diagnosis not present

## 2017-05-17 DIAGNOSIS — I10 Essential (primary) hypertension: Secondary | ICD-10-CM | POA: Insufficient documentation

## 2017-05-17 HISTORY — DX: Unspecified fracture of skull, initial encounter for closed fracture: S02.91XA

## 2017-05-17 LAB — CBC WITH DIFFERENTIAL/PLATELET
BASOS ABS: 0 10*3/uL (ref 0.0–0.1)
BASOS PCT: 0 %
EOS ABS: 0.2 10*3/uL (ref 0.0–0.7)
Eosinophils Relative: 2 %
HCT: 38.8 % (ref 36.0–46.0)
Hemoglobin: 13.2 g/dL (ref 12.0–15.0)
Lymphocytes Relative: 36 %
Lymphs Abs: 3.2 10*3/uL (ref 0.7–4.0)
MCH: 31.7 pg (ref 26.0–34.0)
MCHC: 34 g/dL (ref 30.0–36.0)
MCV: 93 fL (ref 78.0–100.0)
MONOS PCT: 7 %
Monocytes Absolute: 0.6 10*3/uL (ref 0.1–1.0)
Neutro Abs: 4.9 10*3/uL (ref 1.7–7.7)
Neutrophils Relative %: 55 %
PLATELETS: 252 10*3/uL (ref 150–400)
RBC: 4.17 MIL/uL (ref 3.87–5.11)
RDW: 11.7 % (ref 11.5–15.5)
WBC: 8.9 10*3/uL (ref 4.0–10.5)

## 2017-05-17 LAB — COMPREHENSIVE METABOLIC PANEL
ALBUMIN: 4.2 g/dL (ref 3.5–5.0)
ALK PHOS: 34 U/L — AB (ref 38–126)
ALT: 18 U/L (ref 14–54)
ANION GAP: 7 (ref 5–15)
AST: 21 U/L (ref 15–41)
BILIRUBIN TOTAL: 0.6 mg/dL (ref 0.3–1.2)
BUN: 8 mg/dL (ref 6–20)
CALCIUM: 8.6 mg/dL — AB (ref 8.9–10.3)
CO2: 23 mmol/L (ref 22–32)
Chloride: 110 mmol/L (ref 101–111)
Creatinine, Ser: 0.59 mg/dL (ref 0.44–1.00)
GFR calc Af Amer: >60 mL/min (ref >60)
GFR calc non Af Amer: >60 mL/min (ref >60)
GLUCOSE: 105 mg/dL — AB (ref 65–99)
Potassium: 3.8 mmol/L (ref 3.5–5.1)
Sodium: 140 mmol/L (ref 135–145)
TOTAL PROTEIN: 6.7 g/dL (ref 6.5–8.1)

## 2017-05-17 LAB — LIPASE, BLOOD: LIPASE: 31 U/L (ref 11–51)

## 2017-05-17 LAB — HCG, SERUM, QUALITATIVE: Preg, Serum: NEGATIVE

## 2017-05-17 MED ORDER — KETOROLAC TROMETHAMINE 30 MG/ML IJ SOLN
30.0000 mg | Freq: Once | INTRAMUSCULAR | Status: AC
  Administered 2017-05-17: 30 mg via INTRAVENOUS
  Filled 2017-05-17: qty 1

## 2017-05-17 NOTE — ED Provider Notes (Signed)
[facility] EMERGENCY DEPARTMENT Provider Note   CSN: [id number] Arrival date & time: 05/17/17  2233     History   Chief Complaint Chief Complaint  Patient presents with  . Chest Pain    Rt rib    HPI Shannon Landry is a 37 y.o. female.  This patient is a 37 year old female presenting with right upper quadrant pain.  This began in the absence of any injury or trauma.  She denies any fevers or chills.  No nausea of vomiting.  No fevers or chills.  She does report some rectal irritation as well, but no bleeding or swelling.   The history is provided by the patient.  Abdominal Pain   This is a new problem. The current episode started 2 days ago. The problem occurs constantly. The problem has been gradually worsening. The pain is located in the RUQ. The pain is moderate. Pertinent negatives include fever, melena, constipation and dysuria. Nothing aggravates the symptoms. Nothing relieves the symptoms.    Past Medical History:  Diagnosis Date  . Chlamydia   . Gonococcal infection   . Headache   . Hypertension   . Pyelonephritis   . Skull fracture [facility])     Patient Active Problem List   Diagnosis Date Noted  . 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 FACIAL NERVE DECOMPRESSION       OB History    Gravida  1   Para      Term      Preterm      AB  1   Living        SAB      TAB  1   Ectopic      Multiple      Live Births               Home Medications    Prior to Admission medications   Medication Sig Start Date End Date Taking? Authorizing Provider  metoprolol succinate (TOPROL-XL) 25 MG 24 hr tablet Take 25 mg by mouth daily.   Yes [provider]  acidophilus (RISAQUAD) CAPS capsule Take 1 capsule by mouth daily.    [provider]  aspirin-acetaminophen-caffeine (EXCEDRIN MIGRAINE) [phone] MG tablet Take 1 tablet by mouth every 6 (six)  hours as needed for headache.    [provider]  fluconazole (DIFLUCAN) 150 MG tablet Take 1 tablet (150 mg total) by mouth daily. 01/28/16   [name], NP  metroNIDAZOLE (FLAGYL) 500 MG tablet Take 500 mg by mouth 3 (three) times daily.    [provider]  OVER THE COUNTER MEDICATION Take 1 tablet by mouth daily. Takes over the counter Fish oil tablets    [provider]    Family History Family History  Problem Relation Age of Onset  . Diabetes Father     Social History Social History   Tobacco Use  . Smoking status: Current Every Day Smoker  . Smokeless tobacco: Never Used  . Tobacco comment: e-cigarettes  Substance Use Topics  . Alcohol use: No  . Drug use: No     Allergies   Sulfa antibiotics   Review of Systems Review of Systems  Constitutional: Negative for fever.  Gastrointestinal: Positive for abdominal pain. Negative for constipation and melena.  Genitourinary: Negative for dysuria.  All other systems reviewed and are negative.    Physical Exam Updated Vital Signs BP Marland Kitchen)  161/99 (BP Location: Left Arm)   Pulse 98   Temp 98.5 F (36.9 C) (Oral)   Resp 18   Ht 5\' 6"  (1.676 m)   Wt 73.2 kg (161 lb 6 oz)   LMP 05/14/2017   SpO2 100%   BMI 26.05 kg/m   Physical Exam  Constitutional: She is oriented to person, place, and time. She appears well-developed and well-nourished. No distress.  HENT:  Head: Normocephalic and atraumatic.  Neck: Normal range of motion. Neck supple.  Cardiovascular: Normal rate and regular rhythm. Exam reveals no gallop and no friction rub.  No murmur heard. Pulmonary/Chest: Effort normal and breath sounds normal. No respiratory distress. She has no wheezes.  Abdominal: Soft. Bowel sounds are normal. She exhibits no distension. There is tenderness. There is no rebound and no guarding.  There is mild ttp in the right upper quadrant.   Musculoskeletal: Normal range of motion.  Neurological: She is  alert and oriented to person, place, and time.  Skin: Skin is warm and dry. She is not diaphoretic.  Nursing note and vitals reviewed.    ED Treatments / Results  Labs (all labs ordered are listed, but only abnormal results are displayed) Labs Reviewed  COMPREHENSIVE METABOLIC PANEL  LIPASE, BLOOD  CBC WITH DIFFERENTIAL/PLATELET  HCG, SERUM, QUALITATIVE    EKG None  Radiology No results found.  Procedures Procedures (including critical care time)  Medications Ordered in ED Medications  ketorolac (TORADOL) 30 MG/ML injection 30 mg (has no administration in time range)     Initial Impression / Assessment and Plan / ED Course  I have reviewed the triage vital signs and the nursing notes.  Pertinent labs & imaging results that were available during my care of the patient were reviewed by me and considered in my medical decision making (see chart for details).  Patient presents with pain to her right upper quadrant and right lower ribs.  This is been ongoing for the past 2 days.  Her workup reveals no evidence for a gallbladder as the culprit.  She has no shortness of breath, chest pain, or pleuritic component.  He also complains of rectal and pain when she has a bowel movement.  She denies any bleeding or swelling.  She tells me that she had a positive chlamydial swab of her rectum 1 month ago [facility].  She was treated with and a shot and 4 pills.  She is concerned that may be this infection is coming back.  She reports that both her and her partner were treated and waited the 2 weeks before resuming sexual activity.  She will be treated with doxycycline and is to follow-up as needed if not improving.  Final Clinical Impressions(s) / ED Diagnoses   Final diagnoses:  RUQ pain    ED Discharge Orders    None       Geoffery Lyonselo, Sawsan Riggio, MD 05/18/17 [id number]

## 2017-05-17 NOTE — ED Triage Notes (Signed)
C/o pain to right rib area x 2 days-denies injury-NAD-steady gait

## 2017-05-18 MED ORDER — DOXYCYCLINE HYCLATE 100 MG PO TABS
100.0000 mg | ORAL_TABLET | Freq: Once | ORAL | Status: AC
  Administered 2017-05-18: 100 mg via ORAL
  Filled 2017-05-18: qty 1

## 2017-05-18 MED ORDER — DOXYCYCLINE HYCLATE 100 MG PO CAPS: 100.0000 mg | ORAL_CAPSULE | Freq: Two times a day (BID) | ORAL | 0 refills | Status: DC

## 2017-05-18 NOTE — ED Notes (Signed)
ED Provider at bedside. 

## 2017-05-18 NOTE — ED Notes (Signed)
Pt verbalizes understanding of d/c instructions and denies any further needs at this time. 

## 2017-05-18 NOTE — Discharge Instructions (Addendum)
Doxycycline as prescribed.  We will call you if your cultures indicate you require further treatment or action.

## 2017-05-18 NOTE — ED Notes (Signed)
Bottom feels uncomfortable per patient

## 2017-05-19 LAB — GC/CHLAMYDIA PROBE AMP (~~LOC~~) NOT AT ARMC
Chlamydia: NEGATIVE
Neisseria Gonorrhea: NEGATIVE

## 2017-05-20 NOTE — ED Notes (Signed)
05/20/2017, Pt. Called and STD results reviewed with pt.  All questions answered.  Pt. Verbalized understanding.

## 2017-07-20 ENCOUNTER — Other Ambulatory Visit: Payer: Self-pay

## 2017-07-20 DIAGNOSIS — Z23 Encounter for immunization: Secondary | ICD-10-CM | POA: Insufficient documentation

## 2017-07-20 DIAGNOSIS — Y999 Unspecified external cause status: Secondary | ICD-10-CM | POA: Diagnosis not present

## 2017-07-20 DIAGNOSIS — Z79899 Other long term (current) drug therapy: Secondary | ICD-10-CM | POA: Diagnosis not present

## 2017-07-20 DIAGNOSIS — S90931A Unspecified superficial injury of right great toe, initial encounter: Secondary | ICD-10-CM | POA: Insufficient documentation

## 2017-07-20 DIAGNOSIS — F17200 Nicotine dependence, unspecified, uncomplicated: Secondary | ICD-10-CM | POA: Insufficient documentation

## 2017-07-20 DIAGNOSIS — I10 Essential (primary) hypertension: Secondary | ICD-10-CM | POA: Diagnosis not present

## 2017-07-20 DIAGNOSIS — Y929 Unspecified place or not applicable: Secondary | ICD-10-CM | POA: Diagnosis not present

## 2017-07-20 DIAGNOSIS — Y939 Activity, unspecified: Secondary | ICD-10-CM | POA: Diagnosis not present

## 2017-07-20 DIAGNOSIS — W228XXA Striking against or struck by other objects, initial encounter: Secondary | ICD-10-CM | POA: Insufficient documentation

## 2017-07-20 NOTE — ED Triage Notes (Signed)
Pt states great toe pain right foot after injuried by another person foot running into her foot

## 2017-07-21 ENCOUNTER — Emergency Department (HOSPITAL_COMMUNITY)
Admission: EM | Admit: 2017-07-21 | Discharge: 2017-07-21 | Disposition: A | Discharge status: Home / Self Care | Payer: BLUE CROSS/BLUE SHIELD | Attending: Emergency Medicine | Admitting: Emergency Medicine

## 2017-07-21 DIAGNOSIS — S99921A Unspecified injury of right foot, initial encounter: Secondary | ICD-10-CM

## 2017-07-21 MED ORDER — TETANUS-DIPHTH-ACELL PERTUSSIS 5-2.5-18.5 LF-MCG/0.5 IM SUSP
0.5000 mL | Freq: Once | INTRAMUSCULAR | Status: AC
  Administered 2017-07-21: 0.5 mL via INTRAMUSCULAR
  Filled 2017-07-21: qty 0.5

## 2017-07-21 MED ORDER — LIDOCAINE HCL (PF) 1 % IJ SOLN
30.0000 mL | Freq: Once | INTRAMUSCULAR | Status: AC
  Administered 2017-07-21: 30 mL
  Filled 2017-07-21: qty 30

## 2017-07-21 NOTE — ED Provider Notes (Signed)
[facility]-EMERGENCY DEPT Provider Note   CSN: [id number] Arrival date & time: 07/20/17  2310     History   Chief Complaint Chief Complaint  Patient presents with  . Foot Pain    HPI Shannon Landry is a 37 y.o. female with a hx of skull fx, HTN presents to the Emergency Department complaining of acute, persistent, progressively worsening pain in the right great toe onset 9pm.  Pt reports she previously had that toenail removed and only has 1/2 a nail there. She reports she had an acrylic nail applied 2 mos ago without complication. She reports tonight, she ran into a friend's shoe and it lifted the toenail up.  She reports some bleeding and the toenail is loose.  She reports pain with walking.  Nothing seems to make it better.  Unknown tetanus.  No treatments PTA.  Pt denies numbness, weakness.  She is not a diabetic.    The history is provided by the patient and medical records. No language interpreter was used.    Past Medical History:  Diagnosis Date  . Chlamydia   . Gonococcal infection   . Headache   . Hypertension   . Pyelonephritis   . Skull fracture [facility])     Patient Active Problem List   Diagnosis Date Noted  . 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 FACIAL NERVE DECOMPRESSION       OB History    Gravida  1   Para      Term      Preterm      AB  1   Living        SAB      TAB  1   Ectopic      Multiple      Live Births               Home Medications    Prior to Admission medications   Medication Sig Start Date End Date Taking? Authorizing Provider  acidophilus (RISAQUAD) CAPS capsule Take 1 capsule by mouth daily.    [provider]  aspirin-acetaminophen-caffeine (EXCEDRIN MIGRAINE) [phone] MG tablet Take 1 tablet by mouth every 6 (six) hours as needed for headache.    [provider]  doxycycline (VIBRAMYCIN) 100  MG capsule Take 1 capsule (100 mg total) by mouth 2 (two) times daily. One po bid x 7 days 05/18/17   Geoffery Lyons, MD  fluconazole (DIFLUCAN) 150 MG tablet Take 1 tablet (150 mg total) by mouth daily. 01/28/16   [name], NP  metoprolol succinate (TOPROL-XL) 25 MG 24 hr tablet Take 25 mg by mouth daily.    [provider]  metroNIDAZOLE (FLAGYL) 500 MG tablet Take 500 mg by mouth 3 (three) times daily.    [provider]  OVER THE COUNTER MEDICATION Take 1 tablet by mouth daily. Takes over the counter Fish oil tablets    [provider]    Family History Family History  Problem Relation Age of Onset  . Diabetes Father     Social History Social History   Tobacco Use  . Smoking status: Current Every Day Smoker  . Smokeless tobacco: Never Used  . Tobacco comment: e-cigarettes  Substance Use Topics  . Alcohol use: No  . Drug use: No     Allergies   Sulfa antibiotics   Review of Systems Review of Systems  Constitutional: Negative for  fever.  Gastrointestinal: Negative for nausea and vomiting.  Musculoskeletal: Positive for arthralgias.  Skin: Positive for wound.  Allergic/Immunologic: Negative for immunocompromised state.  Neurological: Negative for weakness and numbness.  Hematological: Does not bruise/bleed easily.  Psychiatric/Behavioral: The patient is not nervous/anxious.      Physical Exam Updated Vital Signs BP (!) 136/94 (BP Location: Left Arm)   Pulse 84   Temp 98.4 F (36.9 C) (Oral)   Resp 18   LMP 07/15/2017   SpO2 100%   Physical Exam  Constitutional: She is oriented to person, place, and time. She appears well-developed and well-nourished. No distress.  HENT:  Head: Normocephalic and atraumatic.  Eyes: Conjunctivae are normal. No scleral icterus.  Neck: Normal range of motion.  Cardiovascular: Normal rate, regular rhythm, normal heart sounds and intact distal pulses.  No murmur heard. Capillary refill < 3 sec    Pulmonary/Chest: Effort normal and breath sounds normal. No respiratory distress.  Musculoskeletal: Normal range of motion. She exhibits no edema.  Full range of motion of the right great toe Right great toenail is very loose.  Acrylic nail remains adhered to the natural nail.  No bleeding on my exam.  Laceration to the toe or obvious laceration to the nailbed on initial exam.  Neurological: She is alert and oriented to person, place, and time.  Sensation: Intact to normal touch in the right foot Strength: 5/5 with flexion extension of the right great toe and right foot  Skin: Skin is warm and dry. She is not diaphoretic.  Psychiatric: She has a normal mood and affect.  Nursing note and vitals reviewed.    ED Treatments / Results   Procedures .Nail Removal Date/Time: 07/21/2017 2:00 AM Performed by: Dierdre Forth, PA-C Authorized by: Dierdre Forth, PA-C   Consent:    Consent obtained:  Verbal   Consent given by:  Patient   Risks discussed:  Bleeding, incomplete removal, infection, pain and permanent nail deformity   Alternatives discussed:  No treatment Location:    Foot:  R big toe Pre-procedure details:    Skin preparation:  Alcohol   Preparation: Patient was prepped and draped in the usual sterile fashion   Anesthesia (see MAR for exact dosages):    Anesthesia method:  Nerve block   Block location:  Right great toe   Block needle gauge:  25 G   Block anesthetic:  Lidocaine 1% w/o epi   Block injection procedure:  Anatomic landmarks identified, anatomic landmarks palpated, introduced needle, negative aspiration for blood and incremental injection   Block outcome:  Anesthesia achieved Nail Removal:    Nail removed:  Complete   Nail bed repaired: no (No laceration to the nailbed)     Removed nail replaced and anchored: no     Stented with:  Xeroform gauze Ingrown nail:    Nail matrix removed or ablated:  Partial Post-procedure details:    Dressing:   Xeroform gauze and 4x4 sterile gauze   Patient tolerance of procedure:  Tolerated well, no immediate complications   (including critical care time)  Medications Ordered in ED Medications  lidocaine (PF) (XYLOCAINE) 1 % injection 30 mL (30 mLs Infiltration Given by Other 07/21/17 0128)  Tdap (BOOSTRIX) injection 0.5 mL (0.5 mLs Intramuscular Given 07/21/17 0128)     Initial Impression / Assessment and Plan / ED Course  I have reviewed the triage vital signs and the nursing notes.  Pertinent labs & imaging results that were available during my care of the patient  were reviewed by me and considered in my medical decision making (see chart for details).     Patient presents with injury to the right great toe.  Acrylic and natural nail removed.  No laceration to the nailbed.  Toe was stented with Xeroform gauze.  Hemostasis achieved.  Exam is concerning for possible fungal infection of the natural nail.  Patient will be referred to podiatry for further evaluation and treatment.  No need for antibiotics at this time.  Final Clinical Impressions(s) / ED Diagnoses   Final diagnoses:  Injury of toenail of right foot, initial encounter    ED Discharge Orders    None       Anika Shore, Boyd KerbsHannah, PA-C 07/21/17 0319    Nira Connardama, Pedro Eduardo, MD 07/21/17 [phone]

## 2017-07-21 NOTE — Discharge Instructions (Addendum)
1. Medications: usual home medications 2. Treatment: rest, drink plenty of fluids, keep wound clean with warm soap and water and bandages dry 3. Follow Up: Please followup with podiatry in 3-5 days for discussion of your diagnoses and further evaluation after today's visit; if you do not have a primary care doctor use the resource guide provided to find one; Please return to the ER for signs of infection, worsening pain or other concerns

## 2017-07-23 ENCOUNTER — Emergency Department (HOSPITAL_BASED_OUTPATIENT_CLINIC_OR_DEPARTMENT_OTHER)
Admission: EM | Admit: 2017-07-23 | Discharge: 2017-07-23 | Disposition: A | Discharge status: Home / Self Care | Payer: BLUE CROSS/BLUE SHIELD | Attending: Emergency Medicine | Admitting: Emergency Medicine

## 2017-07-23 ENCOUNTER — Other Ambulatory Visit: Payer: Self-pay

## 2017-07-23 ENCOUNTER — Encounter (HOSPITAL_BASED_OUTPATIENT_CLINIC_OR_DEPARTMENT_OTHER): Payer: Self-pay

## 2017-07-23 DIAGNOSIS — I10 Essential (primary) hypertension: Secondary | ICD-10-CM | POA: Diagnosis not present

## 2017-07-23 DIAGNOSIS — F1210 Cannabis abuse, uncomplicated: Secondary | ICD-10-CM | POA: Diagnosis not present

## 2017-07-23 DIAGNOSIS — K602 Anal fissure, unspecified: Secondary | ICD-10-CM | POA: Diagnosis not present

## 2017-07-23 DIAGNOSIS — Y998 Other external cause status: Secondary | ICD-10-CM | POA: Diagnosis not present

## 2017-07-23 DIAGNOSIS — F17210 Nicotine dependence, cigarettes, uncomplicated: Secondary | ICD-10-CM | POA: Diagnosis not present

## 2017-07-23 DIAGNOSIS — S99922A Unspecified injury of left foot, initial encounter: Secondary | ICD-10-CM | POA: Diagnosis not present

## 2017-07-23 DIAGNOSIS — Z79899 Other long term (current) drug therapy: Secondary | ICD-10-CM | POA: Diagnosis not present

## 2017-07-23 DIAGNOSIS — K640 First degree hemorrhoids: Secondary | ICD-10-CM | POA: Insufficient documentation

## 2017-07-23 DIAGNOSIS — Y9389 Activity, other specified: Secondary | ICD-10-CM | POA: Diagnosis not present

## 2017-07-23 DIAGNOSIS — W228XXA Striking against or struck by other objects, initial encounter: Secondary | ICD-10-CM | POA: Insufficient documentation

## 2017-07-23 DIAGNOSIS — Y929 Unspecified place or not applicable: Secondary | ICD-10-CM | POA: Insufficient documentation

## 2017-07-23 MED ORDER — IBUPROFEN 400 MG PO TABS: 400.0000 mg | ORAL_TABLET | Freq: Four times a day (QID) | ORAL | 0 refills | Status: AC | PRN

## 2017-07-23 MED ORDER — HYDROCORTISONE 1 % EX CREA: TOPICAL_CREAM | CUTANEOUS | 0 refills | Status: AC

## 2017-07-23 NOTE — ED Triage Notes (Signed)
Pt states she hit left great toe/nail on refrigerator door today approx 5pm-NAD-steady gait

## 2017-07-23 NOTE — ED Notes (Signed)
Mentions 3 issues, here for:  1) L great toe nail separation, minimal pain tylenol PTA, no active bleeding. R great toenail recently removed by ED bandaged "w/o complications".  2) concern for hemorrhoids  3) [facility]. (denies: vaginal d/c, bleeding, urinary or vaginal sx, fever, NVD, abd or back pain).

## 2017-07-23 NOTE — ED Notes (Signed)
EDP at [facility] with chaperone ?

## 2017-07-23 NOTE — ED Notes (Signed)
Pt verbalizes understanding of d/c instructions and denies any further needs at this time. 

## 2017-07-24 ENCOUNTER — Inpatient Hospital Stay (HOSPITAL_COMMUNITY)
Admission: AD | Admit: 2017-07-24 | Discharge: 2017-07-24 | Disposition: A | Discharge status: Home / Self Care | Payer: BLUE CROSS/BLUE SHIELD | Source: Ambulatory Visit | Attending: Obstetrics & Gynecology | Admitting: Obstetrics & Gynecology

## 2017-07-24 ENCOUNTER — Other Ambulatory Visit: Payer: Self-pay

## 2017-07-24 ENCOUNTER — Encounter (HOSPITAL_COMMUNITY): Payer: Self-pay

## 2017-07-24 ENCOUNTER — Encounter (HOSPITAL_BASED_OUTPATIENT_CLINIC_OR_DEPARTMENT_OTHER): Payer: Self-pay | Admitting: Emergency Medicine

## 2017-07-24 DIAGNOSIS — Z7251 High risk heterosexual behavior: Secondary | ICD-10-CM | POA: Insufficient documentation

## 2017-07-24 DIAGNOSIS — K644 Residual hemorrhoidal skin tags: Secondary | ICD-10-CM | POA: Insufficient documentation

## 2017-07-24 DIAGNOSIS — N898 Other specified noninflammatory disorders of vagina: Secondary | ICD-10-CM | POA: Diagnosis present

## 2017-07-24 DIAGNOSIS — F17210 Nicotine dependence, cigarettes, uncomplicated: Secondary | ICD-10-CM | POA: Diagnosis not present

## 2017-07-24 DIAGNOSIS — Z711 Person with feared health complaint in whom no diagnosis is made: Secondary | ICD-10-CM

## 2017-07-24 DIAGNOSIS — K640 First degree hemorrhoids: Secondary | ICD-10-CM | POA: Diagnosis not present

## 2017-07-24 LAB — WET PREP, GENITAL
CLUE CELLS WET PREP: NONE SEEN
Sperm: NONE SEEN
Trich, Wet Prep: NONE SEEN
WBC WET PREP: NONE SEEN
YEAST WET PREP: NONE SEEN

## 2017-07-24 LAB — URINALYSIS, ROUTINE W REFLEX MICROSCOPIC
Bilirubin Urine: NEGATIVE
Glucose, UA: NEGATIVE mg/dL
Hgb urine dipstick: NEGATIVE
KETONES UR: NEGATIVE mg/dL
LEUKOCYTES UA: NEGATIVE
NITRITE: NEGATIVE
PROTEIN: NEGATIVE mg/dL
Specific Gravity, Urine: 1.01 (ref 1.005–1.030)
pH: 5 (ref 5.0–8.0)

## 2017-07-24 LAB — POCT PREGNANCY, URINE: Preg Test, Ur: NEGATIVE

## 2017-07-24 NOTE — Discharge Instructions (Signed)
Hemorrhoids Hemorrhoids are swollen veins in and around the rectum or anus. Hemorrhoids can cause pain, itching, or bleeding. Most of the time, they do not cause serious problems. They usually get better with diet changes, lifestyle changes, and other home treatments. Follow these instructions at home: Eating and drinking  Eat foods that have fiber, such as whole grains, beans, nuts, fruits, and vegetables. Ask your doctor about taking products that have added fiber (fibersupplements).  Drink enough fluid to keep your pee (urine) clear or pale yellow. For Pain and Swelling  Take a warm-water bath (sitz bath) for 20 minutes to ease pain. Do this 3-4 times a day.  If directed, put ice on the painful area. It may be helpful to use ice between your warm baths. ? Put ice in a plastic bag. ? Place a towel between your skin and the bag. ? Leave the ice on for 20 minutes, 2-3 times a day. General instructions  Take over-the-counter and prescription medicines only as told by your doctor. ? Medicated creams and medicines that are inserted into the anus (suppositories) may be used or applied as told.  Exercise often.  Go to the bathroom when you have the urge to poop (to have a bowel movement). Do not wait.  Avoid pushing too hard (straining) when you poop.  Keep the butt area dry and clean. Use wet toilet paper or moist paper towels.  Do not sit on the toilet for a long time. Contact a doctor if:  You have any of these: ? Pain and swelling that do not get better with treatment or medicine. ? Bleeding that will not stop. ? Trouble pooping or you cannot poop. ? Pain or swelling outside the area of the hemorrhoids. This information is not intended to replace advice given to you by your health care provider. Make sure you discuss any questions you have with your health care provider. Document Released: 11/12/2007 Document Revised: 07/11/2015 Document Reviewed: 10/17/2014 Elsevier  Interactive Patient Education  2018 [employer] Preventing Sexually Transmitted Infections, Adult Sexually transmitted infections (STIs) are diseases that are passed (transmitted) from person to person through bodily fluids exchanged during sex or sexual contact. Bodily fluids include saliva, semen, blood, vaginal mucus, and urine. You may have an increased risk for developing an STI if you have unprotected oral, vaginal, or anal sex. Some common STIs include:  Herpes.  Hepatitis B.  Chlamydia.  Gonorrhea.  Syphilis.  HPV (human papillomavirus).  HIV (humanimmunodeficiency virus), the virus that can cause AIDS (acquired immunodeficiency virus).  How can I protect myself from sexually transmitted infections? The only way to completely prevent STIs is not to have sex of any kind (practice abstinence). This includes oral, vaginal, or anal sex. If you are sexually active, take these actions to lower your risk of getting an STI:  Have only one sex partner (be monogamous) or limit the number of sexual partners you have.  Stay up-to-date on immunizations. Certain vaccines can lower your risk of getting certain STIs, such as: ? Hepatitis A and B vaccines. You may have been vaccinated as a young child, but likely need a booster shot as a teen or young adult. ? HPV vaccine. This vaccine is recommended if you are a man under age 36 or a woman under age 36.  Use methods that prevent the exchange of body fluids between partners (barrier protection) every time you have sex. Barrier protection can be used during oral, vaginal, or anal sex. Commonly used barrier  methods include: ? Female condom. ? Female condom. ? Dental dam.  Get tested regularly for STIs. Have your sexual partner get tested regularly as well.  Avoid mixing alcohol, drugs, and sex. Alcohol and drug use can affect your ability to make good decisions and can lead to risky sexual behaviors.  Ask your health care provider about  taking pre-exposure prophylaxis (PrEP) to prevent HIV infection if you: ? Have a HIV-positive sexual partner. ? Have multiple sexual partners or partners who do not know their HIV status, and do not regularly use a condom during sex. ? Use injection drugs and share needles.  Birth control pills, injections, implants, and intrauterine devices (IUDs) do not protect against STIs. To prevent both STIs and pregnancy, always use a condom with another form of birth control. Some STIs, such as herpes, are spread through skin to skin contact. A condom does not protect you from getting such STIs. If you or your partner have herpes and there is an active flare with open sores, avoid all sexual contact. Why are these changes important? Taking steps to practice safe sex protects you and others. Many STIs can be cured. However, some STIs are not curable and will affect you for the rest of your life. STIs can be passed on to another person even if you do not have symptoms. What can happen if changes are not made? Certain STIs may:  Require you to take medicine for the rest of your life.  Affect your ability to have children (your fertility).  Increase your risk for developing another STI or certain serious health conditions, such as: ? Cervical cancer. ? Head and neck cancer. ? Pelvic inflammatory disease (PID) in women. ? Organ damage or damage to other parts of your body, if the infection spreads.  Be passed to a baby during childbirth.  How are sexually transmitted infections treated? If you or your partner know or think that you may have an STI:  Talk with your healthcare provider about what can be done to treat it. Some STIs can be treated and cured with medicines.  For curable STIs, you and your partner should avoid sex during treatment and for several days after treatment is complete.  You and your partner should both be treated at the same time, if there is any chance that your partner is  infected as well. If you get treatment but your partner does not, your partner can re-infect you when you resume sexual contact.  Do not have unprotected sex.  Where to find more information: Learn more about sexually transmitted diseases and infections from:  Centers for Disease Control and Prevention: ? More information about specific STIs: SolutionApps.co.za ? Find places to get sexual health counseling and treatment for free or for a low cost: gettested.TonerPromos.no  [place]. Department of Health and Human Services: NotebookPreviews.si.html  Summary  The only way to completely prevent STIs is not to have sex (practice abstinence), including oral, vaginal, or anal sex.  STIs can spread through saliva, semen, blood, vaginal mucus, urine, or sexual contact.  If you do have sex, limit your number of sexual partners and use a barrier protection method every time you have sex.  If you develop an STI, get treated right away and ask your partner to be treated as well. Do not resume having sex until both of you have completed treatment for the STI. This information is not intended to replace advice given to you by your health care provider. Make sure you  discuss any questions you have with your health care provider. Document Released: 01/30/2016 Document Revised: 01/30/2016 Document Reviewed: 01/30/2016 Elsevier Interactive Patient Education  [employer].

## 2017-07-24 NOTE — MAU Note (Signed)
Pt presents with vaginal discharge that began few days ago.  Also reports rectal pain, unsure if hemorrhoids.  Recently treated for GC/Chlamydia, secondary unprotected sex with individual she [facility].  Reports received treatment on Jul 13, 2017.  States she doesn't thinks she's pregnant.

## 2017-07-24 NOTE — ED Provider Notes (Signed)
[facility] EMERGENCY DEPARTMENT Provider Note   CSN: [id number] Arrival date & time: 07/23/17  2157     History   Chief Complaint Chief Complaint  Patient presents with  . Toe Injury    HPI Shannon Landry is a 37 y.o. female.  The history is provided by the patient.  Toe Pain  This is a new problem. The current episode started 3 to 5 hours ago. The problem occurs constantly. The problem has not changed since onset.Pertinent negatives include no chest pain, no abdominal pain, no headaches and no shortness of breath. Nothing aggravates the symptoms. Nothing relieves the symptoms. She has tried nothing for the symptoms. The treatment provided no relief.  Stubbed left great toe, now nail is slightly loose and painful.  Incidentally has rectal pain and wants to know if she has hemorrhoids.    Past Medical History:  Diagnosis Date  . Chlamydia   . Gonococcal infection   . Headache   . Hypertension   . Pyelonephritis   . Skull fracture [facility])     Patient Active Problem List   Diagnosis Date Noted  . 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 FACIAL NERVE DECOMPRESSION       OB History    Gravida  1   Para      Term      Preterm      AB  1   Living        SAB      TAB  1   Ectopic      Multiple      Live Births               Home Medications    Prior to Admission medications   Medication Sig Start Date End Date Taking? Authorizing Provider  acidophilus (RISAQUAD) CAPS capsule Take 1 capsule by mouth daily.    [provider]  aspirin-acetaminophen-caffeine (EXCEDRIN MIGRAINE) [phone] MG tablet Take 1 tablet by mouth every 6 (six) hours as needed for headache.    [provider]  doxycycline (VIBRAMYCIN) 100 MG capsule Take 1 capsule (100 mg total) by mouth 2 (two) times daily. One po bid x 7 days 05/18/17   Geoffery Lyons, MD  fluconazole  (DIFLUCAN) 150 MG tablet Take 1 tablet (150 mg total) by mouth daily. 01/28/16   [name], NP  hydrocortisone cream 1 % Apply to affected area 2 times daily 07/23/17   Kaydan Wong, MD  ibuprofen (ADVIL,MOTRIN) 400 MG tablet Take 1 tablet (400 mg total) by mouth every 6 (six) hours as needed. 07/23/17   Erva Koke, MD  metoprolol succinate (TOPROL-XL) 25 MG 24 hr tablet Take 25 mg by mouth daily.    [provider]  metroNIDAZOLE (FLAGYL) 500 MG tablet Take 500 mg by mouth 3 (three) times daily.    [provider]  OVER THE COUNTER MEDICATION Take 1 tablet by mouth daily. Takes over the counter Fish oil tablets    [provider]    Family History Family History  Problem Relation Age of Onset  . Diabetes Father     Social History Social History   Tobacco Use  . Smoking status: Current Every Day Smoker    Types: Cigarettes  . Smokeless tobacco: Never Used  . Tobacco comment: e-cigarettes  Substance Use Topics  . Alcohol use: No  . Drug use: Yes    Types:  Marijuana     Allergies   Sulfa antibiotics   Review of Systems Review of Systems  Respiratory: Negative for shortness of breath.   Cardiovascular: Negative for chest pain.  Gastrointestinal: Negative for abdominal pain and anal bleeding.  Musculoskeletal: Positive for arthralgias.  Skin: Negative for color change and wound.  Neurological: Negative for headaches.  All other systems reviewed and are negative.    Physical Exam Updated Vital Signs BP (!) 151/87 (BP Location: Left Arm)   Pulse (!) 109   Temp 98.3 F (36.8 C) (Oral)   Resp 18   Ht 5\' 6"  (1.676 m)   Wt 73.3 kg (161 lb 9.6 oz)   LMP 07/15/2017   SpO2 100%   BMI 26.08 kg/m   Physical Exam  Constitutional: She is oriented to person, place, and time. She appears well-developed and well-nourished. No distress.  HENT:  Head: Normocephalic and atraumatic.  Mouth/Throat: No oropharyngeal exudate.  Eyes: Pupils are  equal, round, and reactive to light. Conjunctivae are normal.  Neck: Normal range of motion. Neck supple.  Cardiovascular: Normal rate, regular rhythm, normal heart sounds and intact distal pulses.  Pulmonary/Chest: Effort normal and breath sounds normal. No stridor. She has no wheezes. She has no rales.  Abdominal: Soft. Bowel sounds are normal. She exhibits no mass. There is no tenderness. There is no rebound and no guarding.  Genitourinary:  Genitourinary Comments: Chaperone present grade 1 hemorrhoid not thrombosed and anal fissue 6 oclock   Musculoskeletal: Normal range of motion.  Neurological: She is alert and oriented to person, place, and time.  Skin: Skin is warm and dry. Capillary refill takes less than 2 seconds.  Psychiatric: She has a normal mood and affect.     ED Treatments / Results  Labs (all labs ordered are listed, but only abnormal results are displayed) Labs Reviewed - No data to display  EKG None  Radiology No results found.  Procedures Procedures (including critical care time)  Medications Ordered in ED Medications - No data to display   Patient then states she was treated for STIs five days ago and would like to be seen again for same.  EDP politely stated she was seen and treated for multiple issues and she should contact her GYN or the county health department as this is not an emergent issue at this time.  Use condoms at all times.    Final Clinical Impressions(s) / ED Diagnoses   Final diagnoses:  Grade I hemorrhoids  Anal fissure  Toe injury, left, initial encounter    Return for weakness, numbness, changes in vision or speech, fevers >100.4 unrelieved by medication, shortness of breath, intractable vomiting, or diarrhea, abdominal pain, Inability to tolerate liquids or food, cough, altered mental status or any concerns. No signs of systemic illness or infection. The patient is nontoxic-appearing on exam and vital signs are within normal  limits.   I have reviewed the triage vital signs and the nursing notes. Pertinent labs &imaging results that were available during my care of the patient were reviewed by me and considered in my medical decision making (see chart for details).  After history, exam, and medical workup I feel the patient has been appropriately medically screened and is safe for discharge home. Pertinent diagnoses were discussed with the patient. Patient was given return precautions.   Virgilia Quigg, MD ED Discharge Orders        Ordered    hydrocortisone cream 1 %     07/23/17 2333  ibuprofen (ADVIL,MOTRIN) 400 MG tablet  Every 6 hours PRN     07/23/17 2333       Annalysia Willenbring, MD 07/24/17 [id number]

## 2017-07-24 NOTE — MAU Provider Note (Signed)
History     CSN: [id number]  Arrival date and time: 07/24/17 1408   First Provider Initiated Contact with Patient 07/24/17 1539      Chief Complaint  Patient presents with  . Vaginal Discharge  . Rectal Pain   HPI  Ms. Shannon Landry is a 37 y.o. [id number] who presents to MAU today with complaint of possible STD and rectal pain. The patient states that she had sex with someone that she had just met last Tuesday night. [facility] and told them she had a known exposure so she was treated for Gonorrhea and Chlamydia. She had negative testing that day. She is concerned that treatment was so early that she may still have an infection. She denies vaginal bleeding, pelvic pain or fever. She is also concerned she may have a small hemorrhoid because of new onset rectal pain.   OB History    Gravida  1   Para      Term      Preterm      AB  1   Living        SAB      TAB  1   Ectopic      Multiple      Live Births              Past Medical History:  Diagnosis Date  . Chlamydia   . Gonococcal infection   . Headache   . Hypertension   . Pyelonephritis   . Skull fracture [facility])     Past Surgical History:  Procedure Laterality Date  . FACIAL NERVE DECOMPRESSION      Family History  Problem Relation Age of Onset  . Diabetes Father     Social History   Tobacco Use  . Smoking status: Current Every Day Smoker    Packs/day: 0.50    Types: Cigarettes  . Smokeless tobacco: Never Used  . Tobacco comment: e-cigarettes  Substance Use Topics  . Alcohol use: Yes    Comment: social   . Drug use: Yes    Types: Marijuana    Comment: daily     Allergies:  Allergies  Allergen Reactions  . Sulfa Antibiotics Swelling    No medications prior to admission.    Review of Systems  Constitutional: Negative for fever.  Gastrointestinal: Negative for abdominal pain, constipation, diarrhea, nausea and vomiting.  Genitourinary:  Positive for vaginal discharge. Negative for dysuria, frequency, urgency and vaginal bleeding.   Physical Exam   Blood pressure 132/73, pulse 80, temperature 98.7 F (37.1 C), temperature source Oral, resp. rate 20, height _0  (1.676 m), weight 160 lb 4 oz (72.7 kg), last menstrual period 07/15/2017, SpO2 97 %.  Physical Exam  Nursing note and vitals reviewed. Constitutional: She is oriented to person, place, and time. She appears well-developed and well-nourished. No distress.  HENT:  Head: Normocephalic and atraumatic.  Cardiovascular: Normal rate.  Respiratory: Effort normal.  GI: Soft. She exhibits no distension. There is no tenderness.  Genitourinary: Uterus is not enlarged and not tender. Cervix exhibits no motion tenderness, no discharge and no friability. Right adnexum displays no mass and no tenderness. Left adnexum displays no mass and no tenderness. No bleeding in the vagina. No vaginal discharge found.  Neurological: She is alert and oriented to person, place, and time.  Skin: Skin is warm and dry. No erythema.  Psychiatric: She has a normal mood and affect.    Results  for orders placed or performed during the hospital encounter of 07/24/17 (from the past 24 hour(s))  Urinalysis, Routine w reflex microscopic     Status: None   Collection Time: 07/24/17  3:10 PM  Result Value Ref Range   Color, Urine YELLOW YELLOW   APPearance CLEAR CLEAR   Specific Gravity, Urine 1.010 1.005 - 1.030   pH 5.0 5.0 - 8.0   Glucose, UA NEGATIVE NEGATIVE mg/dL   Hgb urine dipstick NEGATIVE NEGATIVE   Bilirubin Urine NEGATIVE NEGATIVE   Ketones, ur NEGATIVE NEGATIVE mg/dL   Protein, ur NEGATIVE NEGATIVE mg/dL   Nitrite NEGATIVE NEGATIVE   Leukocytes, UA NEGATIVE NEGATIVE  Pregnancy, urine POC     Status: None   Collection Time: 07/24/17  3:30 PM  Result Value Ref Range   Preg Test, Ur NEGATIVE NEGATIVE  Wet prep, genital     Status: None   Collection Time: 07/24/17  3:53 PM  Result  Value Ref Range   Yeast Wet Prep HPF POC NONE SEEN NONE SEEN   Trich, Wet Prep NONE SEEN NONE SEEN   Clue Cells Wet Prep HPF POC NONE SEEN NONE SEEN   WBC, Wet Prep HPF POC NONE SEEN NONE SEEN   Sperm NONE SEEN     MAU Course  Procedures None  MDM UPT - negative UA, wet prep, GC/Chlamydia, HIV and RPR today   Assessment and Plan  A: High risk sexual behavior Concern for STD External non-thrombosed hemorrhoid  P: Discharge home Will await results for re-treatment of GC/Chlamydia if needed  OTC management of hemorrhoids discussed High fiber diet and increase in PO hydration advised  Safe sex precautions discussed Patient advised to follow-up with [facility] as needed  Patient may return to MAU as needed or if her condition were to change or worsen  Kerry Hough, PA-C 07/24/2017, 4:43 PM

## 2017-07-25 LAB — HIV ANTIBODY (ROUTINE TESTING W REFLEX): HIV Screen 4th Generation wRfx: NONREACTIVE

## 2017-07-25 LAB — RPR: RPR: NONREACTIVE

## 2017-07-26 ENCOUNTER — Encounter (HOSPITAL_COMMUNITY): Payer: Self-pay | Admitting: Emergency Medicine

## 2017-07-26 ENCOUNTER — Ambulatory Visit (HOSPITAL_COMMUNITY)
Admission: EM | Admit: 2017-07-26 | Discharge: 2017-07-26 | Disposition: A | Discharge status: Home / Self Care | Payer: BLUE CROSS/BLUE SHIELD | Attending: Family Medicine | Admitting: Family Medicine

## 2017-07-26 DIAGNOSIS — K6289 Other specified diseases of anus and rectum: Secondary | ICD-10-CM

## 2017-07-26 LAB — GC/CHLAMYDIA PROBE AMP (~~LOC~~) NOT AT ARMC
Chlamydia: NEGATIVE
Neisseria Gonorrhea: NEGATIVE

## 2017-07-26 MED ORDER — HYDROCORTISONE 2.5 % RE CREA: TOPICAL_CREAM | RECTAL | 0 refills | Status: AC

## 2017-07-26 NOTE — ED Notes (Signed)
Bed: UC01 Expected date:  Expected time:  Means of arrival:  Comments: HOLD FOR APPTS

## 2017-07-26 NOTE — ED Provider Notes (Signed)
[facility]    CSN: [id number] Arrival date & time: 07/26/17  1316     History   Chief Complaint Chief Complaint  Patient presents with  . Rectal Pain    appt 1334    HPI Shannon Landry is a 37 y.o. female presenting today for evaluation of rectal pain.  Patient notes that she had an unprotected sexual intercourse on 5/28.  She denies anal.  Initially after intercourse she was seen at [facility] and empirically treated for gonorrhea and chlamydia.  A week later she developed rectal pain and went to [facility], screening for STDs was obtained and was negative.  They performed a vaginal exam, but she states that they did not perform a rectal exam.  She has been using over-the-counter hemorrhoid remedies which have helped.  She is mainly concerned about STDs causing her pain.  HPI  Past Medical History:  Diagnosis Date  . Chlamydia   . Gonococcal infection   . Headache   . Hypertension   . Pyelonephritis   . Skull fracture [facility])     Patient Active Problem List   Diagnosis Date Noted  . 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 FACIAL NERVE DECOMPRESSION      OB History    Gravida  1   Para      Term      Preterm      AB  1   Living        SAB      TAB  1   Ectopic      Multiple      Live Births               Home Medications    Prior to Admission medications   Medication Sig Start Date End Date Taking? Authorizing Provider  acidophilus (RISAQUAD) CAPS capsule Take 1 capsule by mouth daily.    [provider]  aspirin-acetaminophen-caffeine (EXCEDRIN MIGRAINE) [phone] MG tablet Take 1 tablet by mouth every 6 (six) hours as needed for headache.    [provider]  doxycycline (VIBRAMYCIN) 100 MG capsule Take 1 capsule (100 mg total) by mouth 2 (two) times daily. One po bid x 7 days 05/18/17   Geoffery Lyons, MD  fluconazole  (DIFLUCAN) 150 MG tablet Take 1 tablet (150 mg total) by mouth daily. 01/28/16   [name], NP  hydrocortisone (ANUSOL-HC) 2.5 % rectal cream Apply rectally 2 times daily 07/26/17   Burnie Hank, [place] C, PA-C  hydrocortisone cream 1 % Apply to affected area 2 times daily 07/23/17   Palumbo, April, MD  ibuprofen (ADVIL,MOTRIN) 400 MG tablet Take 1 tablet (400 mg total) by mouth every 6 (six) hours as needed. 07/23/17   Palumbo, April, MD  metoprolol succinate (TOPROL-XL) 25 MG 24 hr tablet Take 25 mg by mouth daily.    [provider]  metroNIDAZOLE (FLAGYL) 500 MG tablet Take 500 mg by mouth 3 (three) times daily.    [provider]  OVER THE COUNTER MEDICATION Take 1 tablet by mouth daily. Takes over the counter Fish oil tablets    [provider]    Family History Family History  Problem Relation Age of Onset  . Diabetes Father     Social History Social History   Tobacco Use  . Smoking status: Current Every Day Smoker    Packs/day: 0.50    Types: Cigarettes  .  Smokeless tobacco: Never Used  . Tobacco comment: e-cigarettes  Substance Use Topics  . Alcohol use: Yes    Comment: social   . Drug use: Yes    Types: Marijuana    Comment: daily      Allergies   Sulfa antibiotics   Review of Systems Review of Systems  Constitutional: Negative for fever.  Respiratory: Negative for shortness of breath.   Cardiovascular: Negative for chest pain.  Gastrointestinal: Positive for rectal pain. Negative for abdominal pain, anal bleeding, blood in stool, diarrhea, nausea and vomiting.  Genitourinary: Negative for dysuria, flank pain, genital sores, hematuria, menstrual problem, vaginal bleeding, vaginal discharge and vaginal pain.  Musculoskeletal: Negative for back pain.  Skin: Negative for rash.  Neurological: Negative for dizziness, light-headedness and headaches.     Physical Exam Triage Vital Signs ED Triage Vitals [07/26/17 1327]  Enc Vitals Group      BP (!) 133/93     Pulse Rate (!) 101     Resp 18     Temp 98.5 F (36.9 C)     Temp Source Oral     SpO2 97 %     Weight      Height      Head Circumference      Peak Flow      Pain Score      Pain Loc      Pain Edu?      Excl. in GC?    No data found.  Updated Vital Signs BP (!) 133/93 (BP Location: Left Arm)   Pulse (!) 101   Temp 98.5 F (36.9 C) (Oral)   Resp 18   LMP 07/15/2017   SpO2 97%   Visual Acuity Right Eye Distance:   Left Eye Distance:   Bilateral Distance:    Right Eye Near:   Left Eye Near:    Bilateral Near:     Physical Exam  Constitutional: She appears well-developed and well-nourished. No distress.  HENT:  Head: Normocephalic and atraumatic.  Eyes: Conjunctivae are normal.  Neck: Neck supple.  Cardiovascular: Normal rate and regular rhythm.  No murmur heard. Pulmonary/Chest: Effort normal and breath sounds normal. No respiratory distress.  Abdominal: Soft. There is no tenderness.  Genitourinary:  Genitourinary Comments: External rectum with signs of previous external hemorrhoid, currently not inflamed, engorged or thrombosed.  Nontender to palpation at this time.  DRE without palpation of masses.  Musculoskeletal: She exhibits no edema.  Neurological: She is alert.  Skin: Skin is warm and dry.  Psychiatric: She has a normal mood and affect.  Nursing note and vitals reviewed.    UC Treatments / Results  Labs (all labs ordered are listed, but only abnormal results are displayed) Labs Reviewed  CERVICOVAGINAL ANCILLARY ONLY    EKG None  Radiology No results found.  Procedures Procedures (including critical care time)  Medications Ordered in UC Medications - No data to display  Initial Impression / Assessment and Plan / UC Course  I have reviewed the triage vital signs and the nursing notes.  Pertinent labs & imaging results that were available during my care of the patient were reviewed by me and considered in my medical  decision making (see chart for details).     Rectal pain likely related to external hemorrhoid he has been responsiveness to over-the-counter measures as well as given visualization on exam.  Advised to use Anusol HC as well as perform sitz bath's for further management.  Discussed constipation measures to  help prevent this from flaring.  Did provide swab of rectum to send off for STDs to double check as causes given recent intercourse. Discussed strict return precautions. Patient verbalized understanding and is agreeable with plan.  Final Clinical Impressions(s) / UC Diagnoses   Final diagnoses:  Rectal pain     Discharge Instructions     Rectal pain likely found hemorrhoid, please Anusol cream as   ED Prescriptions    Medication Sig Dispense Auth. Provider   hydrocortisone (ANUSOL-HC) 2.5 % rectal cream Apply rectally 2 times daily 28.35 g Arael Piccione C, PA-C     Controlled Substance Prescriptions [place] Controlled Substance Registry consulted? Not Applicable   Lew DawesWieters, Khian Remo C, [place]PA-C 07/26/17 1714

## 2017-07-26 NOTE — Discharge Instructions (Signed)
Rectal pain likely found hemorrhoid, please Anusol cream as

## 2017-07-26 NOTE — ED Triage Notes (Signed)
Pt sts rectal pain unsure if from hemorrhoid after having intercourse on 5/28; pt already treated for STDs but requesting eval of pain in rectal area

## 2017-07-27 LAB — CERVICOVAGINAL ANCILLARY ONLY
CHLAMYDIA, DNA PROBE: NEGATIVE
NEISSERIA GONORRHEA: NEGATIVE
Trichomonas: NEGATIVE

## 2017-08-10 ENCOUNTER — Emergency Department (HOSPITAL_COMMUNITY)
Admission: EM | Admit: 2017-08-10 | Discharge: 2017-08-10 | Disposition: A | Discharge status: Home / Self Care | Payer: BLUE CROSS/BLUE SHIELD | Attending: Emergency Medicine | Admitting: Emergency Medicine

## 2017-08-10 ENCOUNTER — Other Ambulatory Visit: Payer: Self-pay

## 2017-08-10 ENCOUNTER — Encounter (HOSPITAL_COMMUNITY): Payer: Self-pay | Admitting: Emergency Medicine

## 2017-08-10 DIAGNOSIS — J3489 Other specified disorders of nose and nasal sinuses: Secondary | ICD-10-CM | POA: Diagnosis not present

## 2017-08-10 DIAGNOSIS — F17210 Nicotine dependence, cigarettes, uncomplicated: Secondary | ICD-10-CM | POA: Diagnosis not present

## 2017-08-10 DIAGNOSIS — R51 Headache: Secondary | ICD-10-CM | POA: Diagnosis present

## 2017-08-10 DIAGNOSIS — R519 Headache, unspecified: Secondary | ICD-10-CM

## 2017-08-10 DIAGNOSIS — Z79899 Other long term (current) drug therapy: Secondary | ICD-10-CM | POA: Insufficient documentation

## 2017-08-10 DIAGNOSIS — I10 Essential (primary) hypertension: Secondary | ICD-10-CM | POA: Insufficient documentation

## 2017-08-10 MED ORDER — SALINE SPRAY 0.65 % NA SOLN: 1.0000 | NASAL | 0 refills | Status: AC | PRN

## 2017-08-10 MED ORDER — FLUTICASONE PROPIONATE 50 MCG/ACT NA SUSP: 2.0000 | Freq: Every day | NASAL | 0 refills | Status: AC

## 2017-08-10 MED ORDER — KETOROLAC TROMETHAMINE 30 MG/ML IJ SOLN
30.0000 mg | Freq: Once | INTRAMUSCULAR | Status: AC
  Administered 2017-08-10: 30 mg via INTRAMUSCULAR
  Filled 2017-08-10: qty 1

## 2017-08-10 NOTE — ED Triage Notes (Signed)
Pt reports sinus infection for several days, mucinex for the last four days somewhat effective. Denies fevers. Reports frontal facial pain with runny nose/congestion.

## 2017-08-10 NOTE — ED Provider Notes (Signed)
[facility] EMERGENCY DEPARTMENT Provider Note   CSN: [id number] Arrival date & time: 08/10/17  0400     History   Chief Complaint Chief Complaint  Patient presents with  . Headache    sinus infection    HPI Shannon Landry is a 37 y.o. female.  HPI  This is a 37 year old female with a history of headaches, hypertension who presents with 4-day history of sinus pressure, and headache.  Patient reports a 4-day history of sinus pressure.  She reports frequent nose blowing with production of green mucus.  She has taken Mucinex with minimal relief.  She also reports a cough.  She denies any sore throat, ear pain, fevers.  Since yesterday she developed a left-sided headache.  She describes it as pressure.  There is no radiation of the pain.  Currently her pain is 8 out of 10.  She took Excedrin with minimal relief.  She does report taking some sort of over-the-counter nasal spray.  She is not taking any nasal steroids.  She denies any neck pain or stiffness.  Past Medical History:  Diagnosis Date  . Chlamydia   . Gonococcal infection   . Headache   . Hypertension   . Pyelonephritis   . Skull fracture [facility])     Patient Active Problem List   Diagnosis Date Noted  . 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 FACIAL NERVE DECOMPRESSION       OB History    Gravida  1   Para      Term      Preterm      AB  1   Living        SAB      TAB  1   Ectopic      Multiple      Live Births               Home Medications    Prior to Admission medications   Medication Sig Start Date End Date Taking? Authorizing Provider  acidophilus (RISAQUAD) CAPS capsule Take 1 capsule by mouth daily.    [provider]  aspirin-acetaminophen-caffeine (EXCEDRIN MIGRAINE) 214-276-2500 MG tablet Take 1 tablet by mouth every 6 (six) hours as needed for headache.    [provider]  doxycycline (VIBRAMYCIN) 100 MG capsule Take 1 capsule (100 mg total) by mouth 2 (two) times daily. One po bid x 7 days 05/18/17   Geoffery Lyons, MD  fluconazole (DIFLUCAN) 150 MG tablet Take 1 tablet (150 mg total) by mouth daily. 01/28/16   [name], NP  fluticasone [facility]) 50 MCG/ACT nasal spray Place 2 sprays into both nostrils daily. 08/10/17   Horton, Mayer Masker, MD  hydrocortisone (ANUSOL-HC) 2.5 % rectal cream Apply rectally 2 times daily 07/26/17   Wieters, Pollard C, PA-C  hydrocortisone cream 1 % Apply to affected area 2 times daily 07/23/17   Palumbo, April, MD  ibuprofen (ADVIL,MOTRIN) 400 MG tablet Take 1 tablet (400 mg total) by mouth every 6 (six) hours as needed. 07/23/17   Palumbo, April, MD  metoprolol succinate (TOPROL-XL) 25 MG 24 hr tablet Take 25 mg by mouth daily.    [provider]  metroNIDAZOLE (FLAGYL) 500 MG tablet Take 500 mg by mouth 3 (three) times daily.    [provider]  OVER THE COUNTER MEDICATION Take 1 tablet by mouth daily. Takes over the counter Fish oil  tablets    [provider]  sodium chloride (OCEAN) 0.65 % SOLN nasal spray Place 1 spray into both nostrils as needed for congestion. 08/10/17   Horton, Mayer Masker, MD    Family History Family History  Problem Relation Age of Onset  . Diabetes Father     Social History Social History   Tobacco Use  . Smoking status: Current Every Day Smoker    Packs/day: 0.50    Types: Cigarettes  . Smokeless tobacco: Never Used  . Tobacco comment: e-cigarettes  Substance Use Topics  . Alcohol use: Yes    Comment: social   . Drug use: Yes    Types: Marijuana    Comment: daily      Allergies   Sulfa antibiotics   Review of Systems Review of Systems  Constitutional: Negative for fever.  HENT: Positive for congestion and sinus pressure. Negative for ear pain and sore throat.   Respiratory: Positive for cough. Negative for shortness of breath.     Cardiovascular: Negative for chest pain.  Gastrointestinal: Negative for abdominal pain.  Genitourinary: Negative for dysuria.  All other systems reviewed and are negative.    Physical Exam Updated Vital Signs BP 125/78 (BP Location: Left Arm)   Pulse 88   Temp (!) 97.5 F (36.4 C) (Oral)   Resp 17   Ht 5\' 6"  (1.676 m)   Wt 73 kg (161 lb)   LMP 08/06/2017 (Approximate)   SpO2 95%   BMI 25.99 kg/m   Physical Exam  Constitutional: She is oriented to person, place, and time. She appears well-developed and well-nourished.  HENT:  Head: Normocephalic and atraumatic.  Lateral TMs clear, tenderness bilateral maxillary sinuses, nasal turbinates swollen, oropharynx moist and clear, uvula midline, no tonsillar swelling noted  Eyes: Pupils are equal, round, and reactive to light.  Neck: Normal range of motion. Neck supple.  No meningismus  Cardiovascular: Normal rate, regular rhythm and normal heart sounds.  Pulmonary/Chest: Effort normal and breath sounds normal. No respiratory distress. She has no wheezes.  Abdominal: Soft. Bowel sounds are normal.  Neurological: She is alert and oriented to person, place, and time.  Skin: Skin is warm and dry.  Psychiatric: She has a normal mood and affect.  Nursing note and vitals reviewed.    ED Treatments / Results  Labs (all labs ordered are listed, but only abnormal results are displayed) Labs Reviewed - No data to display  EKG None  Radiology No results found.  Procedures Procedures (including critical care time)  Medications Ordered in ED Medications  ketorolac (TORADOL) 30 MG/ML injection 30 mg (30 mg Intramuscular Given 08/10/17 0531)     Initial Impression / Assessment and Plan / ED Course  I have reviewed the triage vital signs and the nursing notes.  Pertinent labs & imaging results that were available during my care of the patient were reviewed by me and considered in my medical decision making (see chart for  details).     Patient presents with headache.  Reports recent sinus pressure and congestion.  She is overall nontoxic on exam and vital signs are reassuring.  No signs or symptoms of meningitis.  She has tenderness of her maxillary sinuses and swelling of her nasal turbinates.  The patient was given IM Toradol for pain.  Discussed with patient that at this time I would defer antibiotics given absence of fever and short duration of symptoms.  Optimize sinus drainage with nasal saline and Flonase.  Patient had market  improvement with Toradol.  Will discharge home with PCP follow-up.  After history, exam, and medical workup I feel the patient has been appropriately medically screened and is safe for discharge home. Pertinent diagnoses were discussed with the patient. Patient was given return precautions.   Final Clinical Impressions(s) / ED Diagnoses   Final diagnoses:  Sinus headache    ED Discharge Orders        Ordered    fluticasone (FLONASE) 50 MCG/ACT nasal spray  Daily     08/10/17 0603    sodium chloride (OCEAN) 0.65 % SOLN nasal spray  As needed     08/10/17 0603       Shon BatonHorton, Courtney F, MD 08/10/17 [phone]

## 2017-08-21 ENCOUNTER — Encounter (HOSPITAL_COMMUNITY): Payer: Self-pay

## 2017-08-21 ENCOUNTER — Ambulatory Visit (HOSPITAL_COMMUNITY)
Admission: EM | Admit: 2017-08-21 | Discharge: 2017-08-21 | Disposition: A | Discharge status: Home / Self Care | Payer: BLUE CROSS/BLUE SHIELD | Attending: Family Medicine | Admitting: Family Medicine

## 2017-08-21 DIAGNOSIS — Z3202 Encounter for pregnancy test, result negative: Secondary | ICD-10-CM | POA: Diagnosis not present

## 2017-08-21 DIAGNOSIS — Z113 Encounter for screening for infections with a predominantly sexual mode of transmission: Secondary | ICD-10-CM | POA: Diagnosis not present

## 2017-08-21 DIAGNOSIS — N898 Other specified noninflammatory disorders of vagina: Secondary | ICD-10-CM

## 2017-08-21 DIAGNOSIS — F17210 Nicotine dependence, cigarettes, uncomplicated: Secondary | ICD-10-CM | POA: Insufficient documentation

## 2017-08-21 DIAGNOSIS — Z202 Contact with and (suspected) exposure to infections with a predominantly sexual mode of transmission: Secondary | ICD-10-CM

## 2017-08-21 LAB — POCT PREGNANCY, URINE: PREG TEST UR: NEGATIVE

## 2017-08-21 LAB — POCT URINALYSIS DIP (DEVICE)
GLUCOSE, UA: NEGATIVE mg/dL
Hgb urine dipstick: NEGATIVE
KETONES UR: NEGATIVE mg/dL
LEUKOCYTES UA: NEGATIVE
Nitrite: NEGATIVE
Protein, ur: NEGATIVE mg/dL
Specific Gravity, Urine: >=1.03 (ref 1.005–1.030)
Urobilinogen, UA: 0.2 mg/dL (ref 0.0–1.0)
pH: 5.5 (ref 5.0–8.0)

## 2017-08-21 MED ORDER — METRONIDAZOLE 500 MG PO TABS: 500.0000 mg | ORAL_TABLET | Freq: Two times a day (BID) | ORAL | 0 refills | Status: DC

## 2017-08-21 MED ORDER — FLUCONAZOLE 200 MG PO TABS: ORAL_TABLET | ORAL | 0 refills | Status: DC

## 2017-08-21 MED ORDER — LIDOCAINE HCL (PF) 1 % IJ SOLN
INTRAMUSCULAR | Status: AC
  Filled 2017-08-21: qty 2

## 2017-08-21 MED ORDER — AZITHROMYCIN 250 MG PO TABS
1000.0000 mg | ORAL_TABLET | Freq: Once | ORAL | Status: AC
  Administered 2017-08-21: 1000 mg via ORAL

## 2017-08-21 MED ORDER — CEFTRIAXONE SODIUM 250 MG IJ SOLR
INTRAMUSCULAR | Status: AC
  Filled 2017-08-21: qty 250

## 2017-08-21 MED ORDER — CEFTRIAXONE SODIUM 250 MG IJ SOLR
250.0000 mg | Freq: Once | INTRAMUSCULAR | Status: AC
  Administered 2017-08-21: 250 mg via INTRAMUSCULAR

## 2017-08-21 MED ORDER — AZITHROMYCIN 250 MG PO TABS
ORAL_TABLET | ORAL | Status: AC
  Filled 2017-08-21: qty 4

## 2017-08-21 NOTE — Discharge Instructions (Addendum)
Urine did not show signs of infection UPT was negative Given rocephin 250mg  injection and azithromycin 1g in office Vaginal swab obtained Declines HIV/ syphilis testing today Prescribed metronidazole 500 mg twice daily for 7 days (do not take while consuming alcohol) Prescribed diflucan 200 mg once daily and then second dose 72 hours later Take medications as prescribed and to completion We will follow up with you regarding the results of your test If tests are positive, please abstain from sexual activity for at least 7 days and notify partners Follow up with PCP if symptoms persists Return here or go to ER if you have any new or worsening symptoms

## 2017-08-21 NOTE — ED Notes (Signed)
Bed: UC01 Expected date:  Expected time:  Means of arrival:  Comments: appts  

## 2017-08-21 NOTE — ED Triage Notes (Addendum)
Pt presents with complaints of being exposed to Gonorrhea and Chlamydia. Pt is tearful in triage. Reports white discharge x 2-3 days.

## 2017-08-21 NOTE — ED Provider Notes (Signed)
[facility] CARE CENTER   [id number] 08/21/17 Arrival Time: 1317   SUBJECTIVE:  Shannon Landry is a 37 y.o. female who presents with complaints of vaginal discharge that began gradually 2-3 days ago.   Patient is sexually active with 1 female partner who had another partner that was diagnosed with GC/chlamydia.  Last unprotected sex was 3 days ago.  Describes discharge as thick and creamy.  She has NOT tried OTC medications.  Denies worsening symptoms.  She reports similar symptoms in the past. She complains nausea and increased urinary frequency.  She denies fever, chills, vomiting, abdominal or pelvic pain, urinary symptoms, vaginal itching, vaginal odor, vaginal bleeding, dyspareunia, vaginal rashes or lesions.   Patient's last menstrual period was 08/06/2017 (approximate).  ROS: As per HPI.  Past Medical History:  Diagnosis Date  . Chlamydia   . Gonococcal infection   . Headache   . Hypertension   . Pyelonephritis   . Skull fracture [facility])    Past Surgical History:  Procedure Laterality Date  . FACIAL NERVE DECOMPRESSION     Allergies  Allergen Reactions  . Sulfa Antibiotics Swelling   No current facility-administered medications on file prior to encounter.    Current Outpatient Medications on File Prior to Encounter  Medication Sig Dispense Refill  . acidophilus (RISAQUAD) CAPS capsule Take 1 capsule by mouth daily.    Marland Kitchen aspirin-acetaminophen-caffeine (EXCEDRIN MIGRAINE) 250-250-65 MG tablet Take 1 tablet by mouth every 6 (six) hours as needed for headache.    . doxycycline (VIBRAMYCIN) 100 MG capsule Take 1 capsule (100 mg total) by mouth 2 (two) times daily. One po bid x 7 days 14 capsule 0  . fluticasone (FLONASE) 50 MCG/ACT nasal spray Place 2 sprays into both nostrils daily. 16 g 0  . hydrocortisone (ANUSOL-HC) 2.5 % rectal cream Apply rectally 2 times daily 28.35 g 0  . hydrocortisone cream 1 % Apply to affected area 2 times daily 15 g 0  . ibuprofen (ADVIL,MOTRIN)  400 MG tablet Take 1 tablet (400 mg total) by mouth every 6 (six) hours as needed. 30 tablet 0  . metoprolol succinate (TOPROL-XL) 25 MG 24 hr tablet Take 25 mg by mouth daily.    Marland Kitchen OVER THE COUNTER MEDICATION Take 1 tablet by mouth daily. Takes over the counter Fish oil tablets    . sodium chloride (OCEAN) 0.65 % SOLN nasal spray Place 1 spray into both nostrils as needed for congestion. 480 mL 0    Social History   Socioeconomic History  . Marital status: Single    Spouse name: Not on file  . Number of children: Not on file  . Years of education: Not on file  . Highest education level: Not on file  Occupational History  . Not on file  Social Needs  . Financial resource strain: Not on file  . Food insecurity:    Worry: Not on file    Inability: Not on file  . Transportation needs:    Medical: Not on file    Non-medical: Not on file  Tobacco Use  . Smoking status: Current Every Day Smoker    Packs/day: 0.50    Types: Cigarettes  . Smokeless tobacco: Never Used  . Tobacco comment: e-cigarettes  Substance and Sexual Activity  . Alcohol use: Yes    Comment: social   . Drug use: Yes    Types: Marijuana    Comment: daily   . Sexual activity: Yes    Birth control/protection: Condom  Lifestyle  .  Physical activity:    Days per week: Not on file    Minutes per session: Not on file  . Stress: Not on file  Relationships  . Social connections:    Talks on phone: Not on file    Gets together: Not on file    Attends religious service: Not on file    Active member of club or organization: Not on file    Attends meetings of clubs or organizations: Not on file    Relationship status: Not on file  . Intimate partner violence:    Fear of current or ex partner: Not on file    Emotionally abused: Not on file    Physically abused: Not on file    Forced sexual activity: Not on file  Other Topics Concern  . Not on file  Social History Narrative  . Not on file   Family History    Problem Relation Age of Onset  . Diabetes Father     OBJECTIVE:  Vitals:   08/21/17 1329  BP: 133/89  Pulse: 92  Resp: 18  Temp: 98.2 F (36.8 C)  SpO2: 97%     General appearance: alert, cooperative, appears stated age and no distress Throat: lips, mucosa, and tongue normal; teeth and gums normal Lungs: CTA bilaterally without adventitious breath sounds Heart: regular rate and rhythm.  Radial pulses 2+ symmetrical bilaterally Back: no CVA tenderness Abdomen: soft, non-tender; bowel sounds normal; no masses or organomegaly; no guarding or rebound tenderness GU: declines; does agree to vaginal self-swab Skin: warm and dry Psychological:  Alert and cooperative. Tearful during examination   Results for orders placed or performed during the hospital encounter of 08/21/17 (from the past 24 hour(s))  POCT urinalysis dip (device)     Status: Abnormal   Collection Time: 08/21/17  2:05 PM  Result Value Ref Range   Glucose, UA NEGATIVE NEGATIVE mg/dL   Bilirubin Urine SMALL (A) NEGATIVE   Ketones, ur NEGATIVE NEGATIVE mg/dL   Specific Gravity, Urine >=1.030 1.005 - 1.030   Hgb urine dipstick NEGATIVE NEGATIVE   pH 5.5 5.0 - 8.0   Protein, ur NEGATIVE NEGATIVE mg/dL   Urobilinogen, UA 0.2 0.0 - 1.0 mg/dL   Nitrite NEGATIVE NEGATIVE   Leukocytes, UA NEGATIVE NEGATIVE  Pregnancy, urine POC     Status: None   Collection Time: 08/21/17  2:11 PM  Result Value Ref Range   Preg Test, Ur NEGATIVE NEGATIVE    ASSESSMENT & PLAN:  1. STD exposure   2. Vaginal discharge     Meds ordered this encounter  Medications  . azithromycin (ZITHROMAX) tablet 1,000 mg  . cefTRIAXone (ROCEPHIN) injection 250 mg  . metroNIDAZOLE (FLAGYL) 500 MG tablet    Sig: Take 1 tablet (500 mg total) by mouth 2 (two) times daily.    Dispense:  14 tablet    Refill:  0    Order Specific Question:   Supervising Provider    Answer:   Isa Rankin [phone]  . fluconazole (DIFLUCAN) 200 MG tablet     Sig: Take one tablet by mouth, wait 72 hours, and then take second tablet by mouth    Dispense:  2 tablet    Refill:  0    Order Specific Question:   Supervising Provider    Answer:   Isa Rankin [[id number]]    Pending: Labs Reviewed  POCT URINALYSIS DIP (DEVICE) - Abnormal; Notable for the following components:      Result Value  Bilirubin Urine SMALL (*)    All other components within normal limits  POCT PREGNANCY, URINE  CERVICOVAGINAL ANCILLARY ONLY    Urine did not show signs of infection UPT was negative Given rocephin 250mg  injection and azithromycin 1g in office Vaginal swab obtained Declines HIV/ syphilis testing today Prescribed metronidazole 500 mg twice daily for 7 days (do not take while consuming alcohol) Prescribed diflucan 200 mg once daily and then second dose 72 hours later Take medications as prescribed and to completion We will follow up with you regarding the results of your test If tests are positive, please abstain from sexual activity for at least 7 days and notify partners Follow up with PCP if symptoms persists Return here or go to ER if you have any new or worsening symptoms    Reviewed expectations re: course of current medical issues. Questions answered. Outlined signs and symptoms indicating need for more acute intervention. Patient verbalized understanding. After Visit Summary given.       Rennis HardingWurst, Alaiza Yau, PA-C 08/21/17 2113

## 2017-08-23 ENCOUNTER — Telehealth (HOSPITAL_COMMUNITY): Payer: Self-pay | Admitting: Emergency Medicine

## 2017-08-23 LAB — CERVICOVAGINAL ANCILLARY ONLY
BACTERIAL VAGINITIS: POSITIVE — AB
Candida vaginitis: NEGATIVE
Chlamydia: NEGATIVE
NEISSERIA GONORRHEA: NEGATIVE
Trichomonas: NEGATIVE

## 2017-08-23 NOTE — Telephone Encounter (Signed)
Pt called requesting STD testing results; informed of positive BV and confirmed RX for flagyl to fill

## 2018-01-29 ENCOUNTER — Encounter (HOSPITAL_COMMUNITY): Payer: Self-pay | Admitting: Emergency Medicine

## 2018-01-29 ENCOUNTER — Ambulatory Visit (HOSPITAL_COMMUNITY)
Admission: EM | Admit: 2018-01-29 | Discharge: 2018-01-29 | Disposition: A | Discharge status: Home / Self Care | Payer: BLUE CROSS/BLUE SHIELD | Attending: Family Medicine | Admitting: Family Medicine

## 2018-01-29 DIAGNOSIS — K648 Other hemorrhoids: Secondary | ICD-10-CM | POA: Insufficient documentation

## 2018-01-29 DIAGNOSIS — F17210 Nicotine dependence, cigarettes, uncomplicated: Secondary | ICD-10-CM | POA: Diagnosis not present

## 2018-01-29 DIAGNOSIS — R300 Dysuria: Secondary | ICD-10-CM | POA: Diagnosis present

## 2018-01-29 DIAGNOSIS — R195 Other fecal abnormalities: Secondary | ICD-10-CM

## 2018-01-29 DIAGNOSIS — I10 Essential (primary) hypertension: Secondary | ICD-10-CM | POA: Diagnosis not present

## 2018-01-29 DIAGNOSIS — Z882 Allergy status to sulfonamides status: Secondary | ICD-10-CM | POA: Insufficient documentation

## 2018-01-29 NOTE — ED Triage Notes (Signed)
Pt here for dysuria and some mucous per pt

## 2018-01-29 NOTE — ED Provider Notes (Signed)
Patient: YOSSELIN ZOELLER MRN: [id number] DOB: [birth year] PCP: Patient, No Pcp Per     Subjective:  Chief Complaint  Patient presents with  . Dysuria    appt 1415    HPI: The patient is a 37 y.o. female who presents today for stool changes and blood when she wipes. She was seen by doctor on Monday for cyst in groin and was put on doxycyline. She was also tested for STDs on Monday and everything was negative. She is still on the doxycycline. She states about 2 weeks ago she noticed she noticed she had mucous in her stool that was yellow in color. Does not happen every time, but intermittently. Last night she had only slime/pus when she tried to have a BM. She also has blood on the toilet paper when she wipes. She does not have blood in her stool. No abdominal pain. She does have some nausea that is resolved with eating. She does smoke MJ. No family hx of colon cancer or IBD. She has been told she has hemorrhoids. Saw gi a year ago and was told she had 2 internal hemorrhoids. She did have anal sex about one month ago, but rarely does this. Used condom when this was done. She hasn't had fever, but has had chills. She denies any painful BM.   Review of Systems  Constitutional: Positive for chills. Negative for fever and unexpected weight change.  Respiratory: Negative for cough and shortness of breath.   Cardiovascular: Negative for chest pain.  Gastrointestinal: Positive for anal bleeding and nausea. Negative for abdominal pain, blood in stool, constipation, diarrhea, rectal pain and vomiting.  Genitourinary: Negative for dysuria, frequency, hematuria, pelvic pain, urgency and vaginal discharge.  Skin: Negative for rash.    Allergies Patient is allergic to sulfa antibiotics.  Past Medical History Patient  has a past medical history of Chlamydia, Gonococcal infection, Headache, Hypertension, Pyelonephritis, and Skull fracture (HCC).  Surgical History Patient  has a past surgical history  that includes Facial nerve decompression.  Family History Pateint's family history includes Diabetes in her father.  Social History Patient  reports that she has been smoking cigarettes. She has been smoking about 0.50 packs per day. She has never used smokeless tobacco. She reports current alcohol use. She reports current drug use. Drug: Marijuana.    Objective: Vitals:   01/29/18 1430  BP: (!) 142/91  Pulse: 89  Resp: 18  Temp: 98.1 F (36.7 C)  TempSrc: Oral  SpO2: 95%    There is no height or weight on file to calculate BMI.  Physical Exam Vitals signs reviewed.  Constitutional:      Appearance: Normal appearance.  Neck:     Musculoskeletal: Normal range of motion and neck supple.  Cardiovascular:     Rate and Rhythm: Normal rate and regular rhythm.     Heart sounds: Normal heart sounds.  Pulmonary:     Effort: Pulmonary effort is normal.     Breath sounds: Normal breath sounds.  Abdominal:     General: Abdomen is flat. Bowel sounds are normal. There is no distension.     Palpations: Abdomen is soft.     Tenderness: There is no abdominal tenderness.  Genitourinary:    Rectum: Normal.     Comments: Rectal exam normal. No external hemorrhoids. No internal hemorrhoids or mass felt on exam. Normal tone.  Skin:    General: Skin is warm and dry.     Capillary Refill: Capillary refill takes less than  2 seconds.  Neurological:     Mental Status: She is alert.       Assessment/plan:  Change in consistency of stool -I think this is either a dietary/transiet issue or more absorption issue. I swabbed her rectum for GC/C as requested as she has had this there before; however, I discussed with her that her GC/C vaginal swab testing was negative this week so very low threshold for this. No indication for IBD/obstruction/infection.  Advised her to increase water/fiber in her diet and stop smoking MJ. If this does not resolve in one week would follow up with her GI physician  for further testing.     Orland MustardAllison Zaryah Seckel, MD  01/29/2018    Orland MustardWolfe, Railyn House, MD 01/29/18 [phone]

## 2018-01-29 NOTE — Discharge Instructions (Addendum)
-  will test for GC/C, but I doubt this is positive.   -changes in consistency of stool more consistent with absorption issue.  -usually send to GI for further work up -would increase water/fiber in diet. Can get metamucil/benefiber to put in water.   -f/u with pcp or GI.

## 2018-01-31 LAB — CERVICOVAGINAL ANCILLARY ONLY
Chlamydia: NEGATIVE
Neisseria Gonorrhea: NEGATIVE

## 2018-02-03 ENCOUNTER — Encounter: Payer: Self-pay | Admitting: Family Medicine

## 2018-04-16 ENCOUNTER — Ambulatory Visit (HOSPITAL_COMMUNITY)
Admission: EM | Admit: 2018-04-16 | Discharge: 2018-04-16 | Disposition: A | Discharge status: Home / Self Care | Payer: BLUE CROSS/BLUE SHIELD | Attending: Family Medicine | Admitting: Family Medicine

## 2018-04-16 ENCOUNTER — Encounter (HOSPITAL_COMMUNITY): Payer: Self-pay

## 2018-04-16 DIAGNOSIS — Z3202 Encounter for pregnancy test, result negative: Secondary | ICD-10-CM

## 2018-04-16 DIAGNOSIS — R1031 Right lower quadrant pain: Secondary | ICD-10-CM | POA: Diagnosis not present

## 2018-04-16 DIAGNOSIS — R1032 Left lower quadrant pain: Secondary | ICD-10-CM | POA: Diagnosis not present

## 2018-04-16 LAB — POCT URINALYSIS DIP (DEVICE)
Bilirubin Urine: NEGATIVE
Glucose, UA: NEGATIVE mg/dL
HGB URINE DIPSTICK: NEGATIVE
KETONES UR: NEGATIVE mg/dL
Leukocytes,Ua: NEGATIVE
Nitrite: NEGATIVE
PH: 5.5 (ref 5.0–8.0)
PROTEIN: NEGATIVE mg/dL
SPECIFIC GRAVITY, URINE: 1.02 (ref 1.005–1.030)
Urobilinogen, UA: 0.2 mg/dL (ref 0.0–1.0)

## 2018-04-16 LAB — POCT PREGNANCY, URINE: Preg Test, Ur: NEGATIVE

## 2018-04-16 NOTE — Discharge Instructions (Signed)
Declines further work-up and evaluation in the ED at this time Urine did not show signs of infection.  We will send out to culture and notify you of abnormal results Urine pregnancy negative Vaginal self swab obtained.  Will hold off on treatment pending results We will follow up with you regarding the results of your test If tests are positive, please abstain from sexual activity for at least 7 days and notify partners Follow up with PCP if symptoms persists Return here or go to ER if you have any new or worsening symptoms such as fever, chills, vomiting, chest pain, shortness of breath, diarrhea, constipation, blood in stool or urine, flank pain, loss of bowel or bladder function, vaginal discharge, vaginal odor, vaginal bleeding, pain with intercourse, pelvic pain, etc..Marland Kitchen

## 2018-04-16 NOTE — ED Provider Notes (Signed)
[facility]   [id number] 04/16/18 Arrival Time: 1354  CC: ABDOMINAL DISCOMFORT  SUBJECTIVE:  Shannon Landry is a 38 y.o. female who presents with complaint of sudden abdominal discomfort x 2 days.  Denies a precipitating event, trauma, close contacts with similar symptoms, recent travel or antibiotic use.  Reports sexual activity prior to symptoms, but used protection.  Localizes discomfort to suprapubic region.  Describes as improving, intermittent and sharp in character.  Has tried  tylenol pain medication with relief.  Denies aggravating factors. Denies association with eating, using the restroom, or sexual intercourse. Reports similar symptoms in the past with ruptured ovarian cyst.  Last BM today and normal for patient.  Complains of nausea, difficulty with urination, as well as cloudy urine.  Denies fever, chills, appetite changes, weight changes, vomiting, chest pain, SOB, diarrhea, constipation, hematochezia, melena, dysuria, increased frequency or urgency, flank pain, loss of bowel or bladder function, changes in normal vaginal discharge, vaginal bleeding, dyspareunia, pelvic pain.     Patient's last menstrual period was 04/01/2018.  ROS: As per HPI.  Past Medical History:  Diagnosis Date  . Chlamydia   . Gonococcal infection   . Headache   . Hypertension   . Pyelonephritis   . Skull fracture [facility])    Past Surgical History:  Procedure Laterality Date  . FACIAL NERVE DECOMPRESSION     Allergies  Allergen Reactions  . Sulfa Antibiotics Swelling   No current facility-administered medications on file prior to encounter.    Current Outpatient Medications on File Prior to Encounter  Medication Sig Dispense Refill  . acidophilus (RISAQUAD) CAPS capsule Take 1 capsule by mouth daily.    Marland Kitchen aspirin-acetaminophen-caffeine (EXCEDRIN MIGRAINE) 250-250-65 MG tablet Take 1 tablet by mouth every 6 (six) hours as needed for headache.    . fluticasone (FLONASE) 50  MCG/ACT nasal spray Place 2 sprays into both nostrils daily. 16 g 0  . hydrocortisone (ANUSOL-HC) 2.5 % rectal cream Apply rectally 2 times daily 28.35 g 0  . hydrocortisone cream 1 % Apply to affected area 2 times daily 15 g 0  . ibuprofen (ADVIL,MOTRIN) 400 MG tablet Take 1 tablet (400 mg total) by mouth every 6 (six) hours as needed. 30 tablet 0  . metoprolol succinate (TOPROL-XL) 25 MG 24 hr tablet Take 25 mg by mouth daily.    Marland Kitchen OVER THE COUNTER MEDICATION Take 1 tablet by mouth daily. Takes over the counter Fish oil tablets    . sodium chloride (OCEAN) 0.65 % SOLN nasal spray Place 1 spray into both nostrils as needed for congestion. 480 mL 0   Social History   Socioeconomic History  . Marital status: Single    Spouse name: Not on file  . Number of children: Not on file  . Years of education: Not on file  . Highest education level: Not on file  Occupational History  . Not on file  Social Needs  . Financial resource strain: Not on file  . Food insecurity:    Worry: Not on file    Inability: Not on file  . Transportation needs:    Medical: Not on file    Non-medical: Not on file  Tobacco Use  . Smoking status: Current Every Day Smoker    Packs/day: 0.50    Types: Cigarettes  . Smokeless tobacco: Never Used  . Tobacco comment: e-cigarettes  Substance and Sexual Activity  . Alcohol use: Yes    Comment: social   . Drug use: Yes  Types: Marijuana    Comment: daily   . Sexual activity: Yes    Birth control/protection: Condom  Lifestyle  . Physical activity:    Days per week: Not on file    Minutes per session: Not on file  . Stress: Not on file  Relationships  . Social connections:    Talks on phone: Not on file    Gets together: Not on file    Attends religious service: Not on file    Active member of club or organization: Not on file    Attends meetings of clubs or organizations: Not on file    Relationship status: Not on file  . Intimate partner violence:     Fear of current or ex partner: Not on file    Emotionally abused: Not on file    Physically abused: Not on file    Forced sexual activity: Not on file  Other Topics Concern  . Not on file  Social History Narrative  . Not on file   Family History  Problem Relation Age of Onset  . Diabetes Father      OBJECTIVE:  Vitals:   04/16/18 1416  BP: 125/81  Pulse: 79  Resp: 16  Temp: 98.9 F (37.2 C)  TempSrc: Oral  SpO2: 100%    General appearance: Alert; NAD HEENT: NCAT.  Oropharynx clear.  Lungs: clear to auscultation bilaterally without adventitious breath sounds Heart: regular rate and rhythm.  Radial pulses 2+ symmetrical bilaterally Abdomen: soft, non-distended; normal active bowel sounds; mild diffuse tenderness about the lower abdomen; negative Murphy's sign; negative rebound; no guarding Back: no CVA tenderness GU: declines; agrees to vaginal self swab Extremities: no edema; symmetrical with no gross deformities Skin: warm and dry Neurologic: normal gait Psychological: alert and cooperative; normal mood and affect  LABS: Results for orders placed or performed during the hospital encounter of 04/16/18 (from the past 24 hour(s))  POCT urinalysis dip (device)     Status: None   Collection Time: 04/16/18  2:24 PM  Result Value Ref Range   Glucose, UA NEGATIVE NEGATIVE mg/dL   Bilirubin Urine NEGATIVE NEGATIVE   Ketones, ur NEGATIVE NEGATIVE mg/dL   Specific Gravity, Urine 1.020 1.005 - 1.030   Hgb urine dipstick NEGATIVE NEGATIVE   pH 5.5 5.0 - 8.0   Protein, ur NEGATIVE NEGATIVE mg/dL   Urobilinogen, UA 0.2 0.0 - 1.0 mg/dL   Nitrite NEGATIVE NEGATIVE   Leukocytes,Ua NEGATIVE NEGATIVE  Pregnancy, urine POC     Status: None   Collection Time: 04/16/18  2:32 PM  Result Value Ref Range   Preg Test, Ur NEGATIVE NEGATIVE   ASSESSMENT & PLAN:  1. Bilateral lower abdominal discomfort    Declines further work-up and evaluation in the ED at this time Urine did not  show signs of infection.  We will send out to culture and notify you of abnormal results Urine pregnancy negative Vaginal self swab obtained.  Will hold off on treatment pending results We will follow up with you regarding the results of your test If tests are positive, please abstain from sexual activity for at least 7 days and notify partners Follow up with PCP or [facility] if symptoms persists Return here or go to ER if you have any new or worsening symptoms such as fever, chills, vomiting, chest pain, shortness of breath, diarrhea, constipation, blood in stool or urine, flank pain, loss of bowel or bladder function, vaginal discharge, vaginal odor, vaginal bleeding, pain with intercourse, pelvic  pain, etc...    Reviewed expectations re: course of current medical issues. Questions answered. Outlined signs and symptoms indicating need for more acute intervention. Patient verbalized understanding. After Visit Summary given.   Rennis Harding, PA-C 04/16/18 1510

## 2018-04-16 NOTE — ED Triage Notes (Signed)
Pt present left lower abdominal pain. Symptoms started on 2 days ago.

## 2018-04-17 LAB — URINE CULTURE

## 2018-04-22 LAB — CERVICOVAGINAL ANCILLARY ONLY
Bacterial vaginitis: NEGATIVE
CHLAMYDIA, DNA PROBE: NEGATIVE
Candida vaginitis: NEGATIVE
Neisseria Gonorrhea: NEGATIVE
TRICH (WINDOWPATH): NEGATIVE
